# Patient Record
Sex: Female | Born: 1980 | Race: White | Hispanic: No | Marital: Married | Smoking: Former smoker
Health system: Southern US, Community
[De-identification: ages and names within clinical notes are randomized; demographics above are authoritative.]

## PROBLEM LIST (undated history)

## (undated) DIAGNOSIS — K649 Unspecified hemorrhoids: Secondary | ICD-10-CM

## (undated) DIAGNOSIS — K219 Gastro-esophageal reflux disease without esophagitis: Secondary | ICD-10-CM

## (undated) DIAGNOSIS — F419 Anxiety disorder, unspecified: Secondary | ICD-10-CM

## (undated) DIAGNOSIS — K602 Anal fissure, unspecified: Secondary | ICD-10-CM

## (undated) DIAGNOSIS — O1003 Pre-existing essential hypertension complicating the puerperium: Secondary | ICD-10-CM

## (undated) DIAGNOSIS — R87619 Unspecified abnormal cytological findings in specimens from cervix uteri: Secondary | ICD-10-CM

## (undated) DIAGNOSIS — O4290 Premature rupture of membranes, unspecified as to length of time between rupture and onset of labor, unspecified weeks of gestation: Secondary | ICD-10-CM

## (undated) HISTORY — DX: Gastro-esophageal reflux disease without esophagitis: K21.9

## (undated) HISTORY — DX: Anal fissure, unspecified: K60.2

## (undated) HISTORY — DX: Unspecified abnormal cytological findings in specimens from cervix uteri: R87.619

## (undated) HISTORY — DX: Anxiety disorder, unspecified: F41.9

## (undated) HISTORY — DX: Unspecified hemorrhoids: K64.9

## (undated) HISTORY — DX: Pre-existing essential hypertension complicating the puerperium: O10.03

---

## 2001-09-14 ENCOUNTER — Other Ambulatory Visit: Admission: RE | Admit: 2001-09-14 | Discharge: 2001-09-14 | Payer: Self-pay | Admitting: Obstetrics and Gynecology

## 2003-03-10 ENCOUNTER — Other Ambulatory Visit: Admission: RE | Admit: 2003-03-10 | Discharge: 2003-03-10 | Payer: Self-pay | Admitting: Obstetrics and Gynecology

## 2004-02-24 ENCOUNTER — Ambulatory Visit: Payer: Self-pay | Admitting: Family Medicine

## 2004-04-06 ENCOUNTER — Other Ambulatory Visit: Admission: RE | Admit: 2004-04-06 | Discharge: 2004-04-06 | Payer: Self-pay | Admitting: Obstetrics and Gynecology

## 2004-05-23 ENCOUNTER — Ambulatory Visit (HOSPITAL_COMMUNITY): Admission: RE | Admit: 2004-05-23 | Discharge: 2004-05-23 | Payer: Self-pay | Admitting: Obstetrics and Gynecology

## 2005-02-08 ENCOUNTER — Ambulatory Visit: Payer: Self-pay | Admitting: Family Medicine

## 2005-06-11 ENCOUNTER — Other Ambulatory Visit: Admission: RE | Admit: 2005-06-11 | Discharge: 2005-06-11 | Payer: Self-pay | Admitting: Obstetrics and Gynecology

## 2006-01-14 IMAGING — US US OB TRANSVAGINAL MODIFY
1 series · 18 of 28 positions shown · non-contrast
Comparison: none

CLINICAL DATA: Assess viability.
EARLY OBSTETRICAL ULTRASOUND WITH TRANSVAGINAL:
Multiple images of the uterus and adnexa were obtained using a transabdominal and endovaginal approaches. 
The uterus is retroverted in nature.  There is an intrauterine gestational sac identified which has a sac size correlating with a 6 week 2 day gestation.  There is a yolk sac visualized, but no evidence for a fetal pole is yet seen.  This would not necessarily be expected at today?s mean sac diameter of 1.4 cm, and follow-up in greater than four days would be recommended for assessment for appropriate progression of pregnancy, as we would expect to see a fetal pole by the time the mean sac diameter reaches 1.8 cm.  No evidence for subchorionic hemorrhage is noted.  
Both ovaries are visualized with the left ovary measuring 3.9 x 2.6 x 2.4 cm and containing a corpus luteum cyst.  The right ovary measures 2.5 x 1.0 x 2.6 cm and has a normal appearance.  No cul-de-sac or periovarian fluid is seen and no separate adnexal masses are noted.

[Series 1: us ob comp less 14 wks · 18 of 54 slices shown]
[im 1/54]
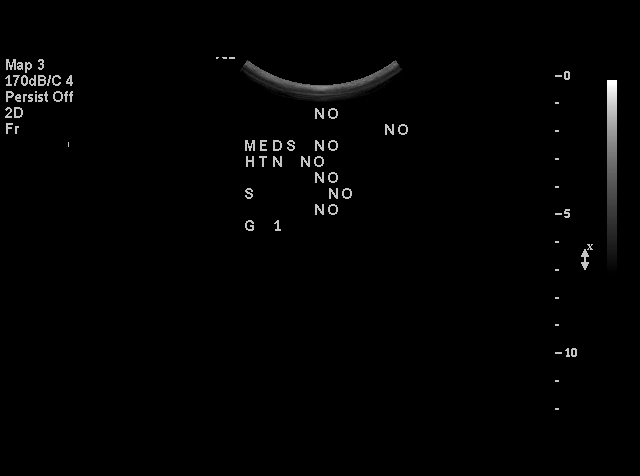
[im 4/54]
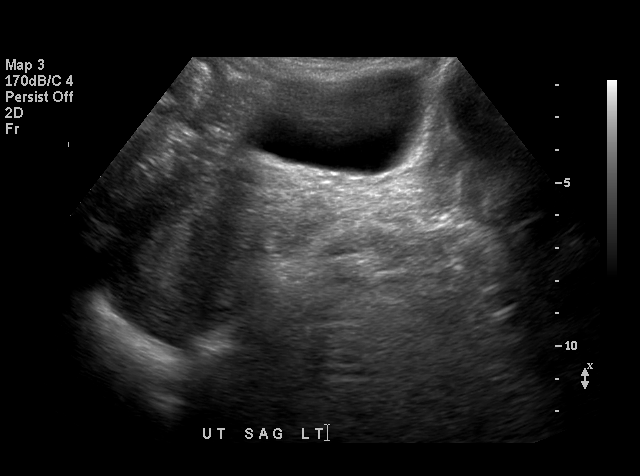
[im 6/54]
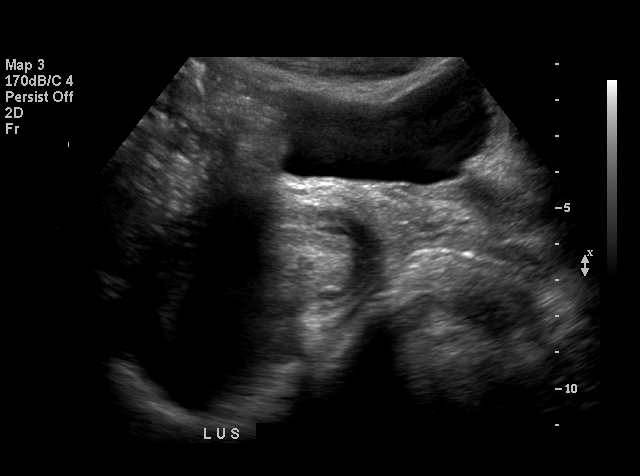
[im 10/54]
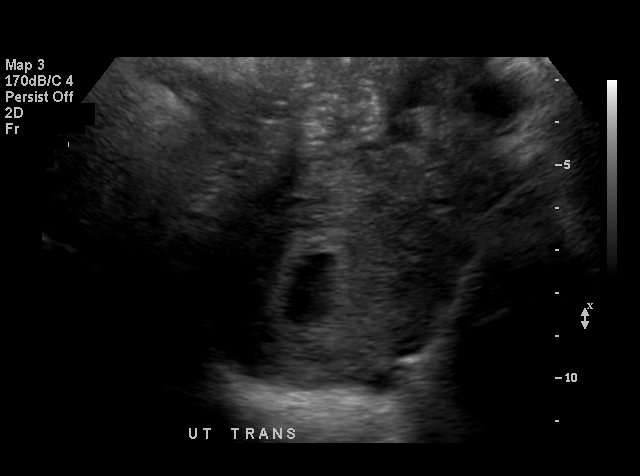
[im 14/54]
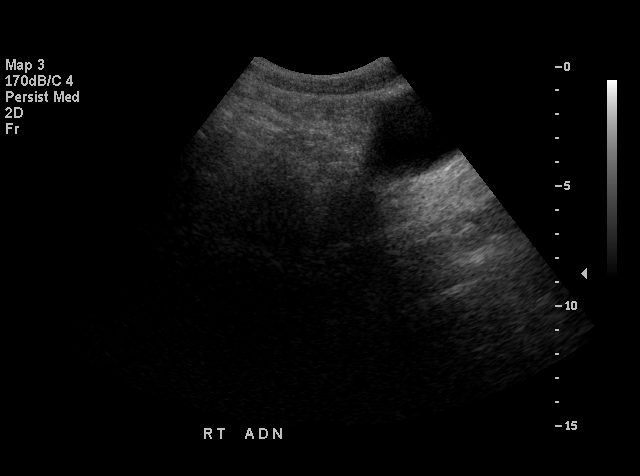
[im 16/54]
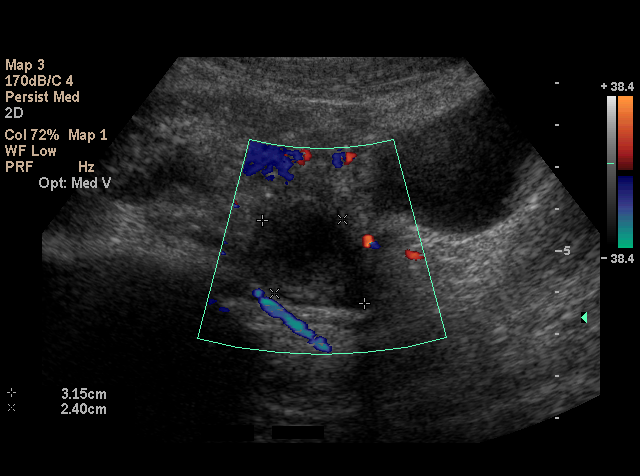
[im 20/54]
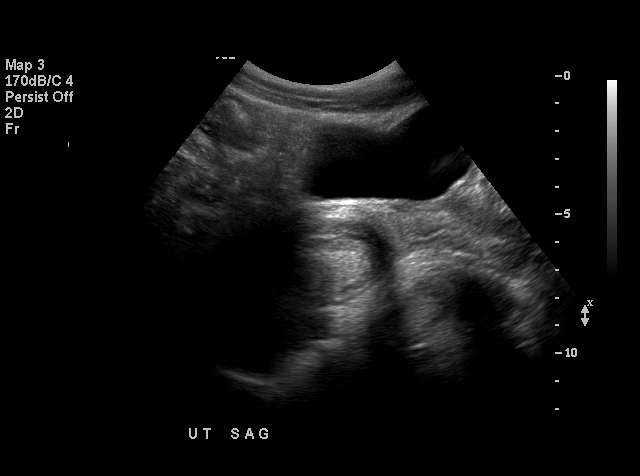
[im 22/54]
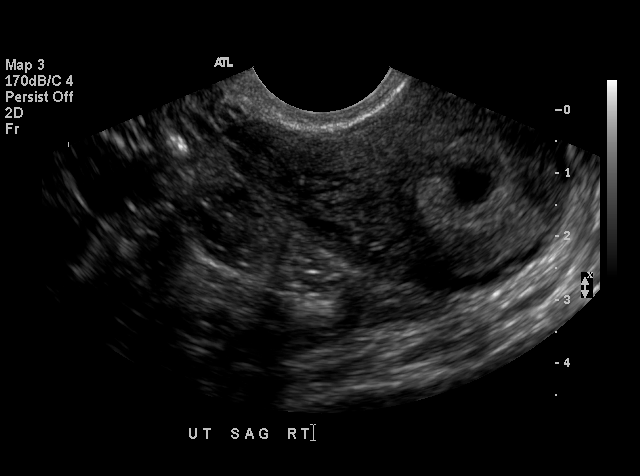
[im 26/54]
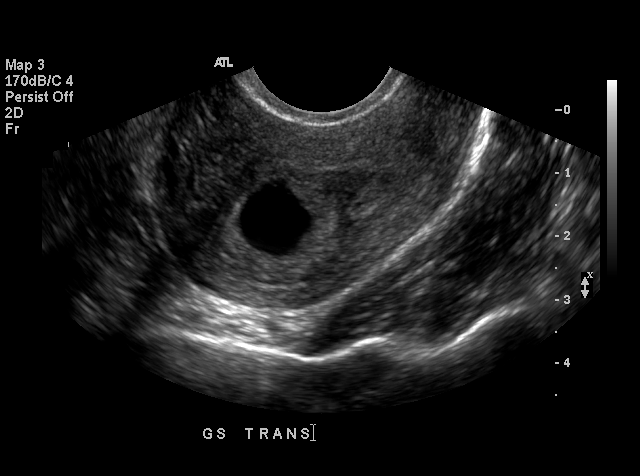
[im 28/54]
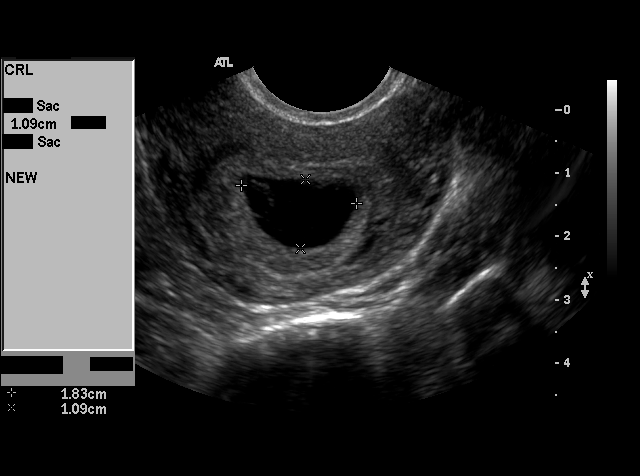
[im 32/54]
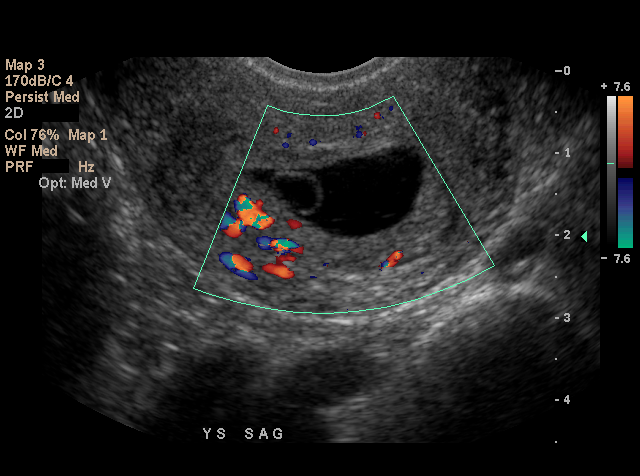
[im 34/54]
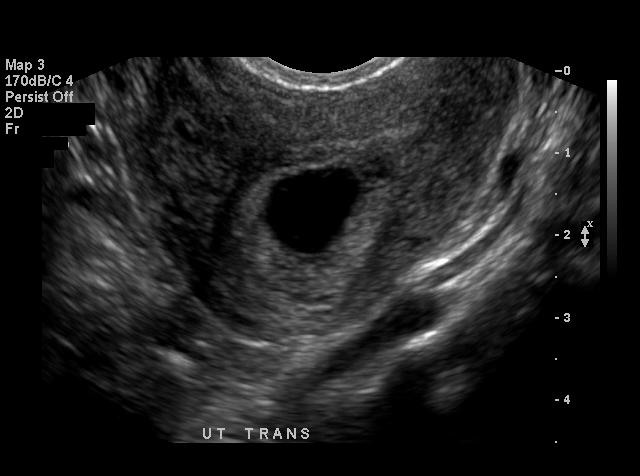
[im 38/54]
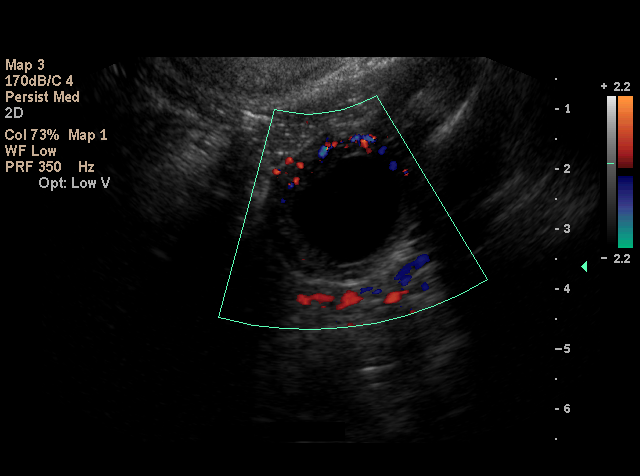
[im 42/54]
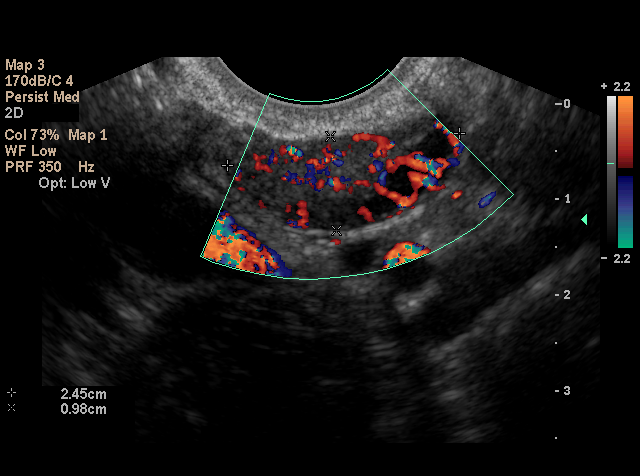
[im 44/54]
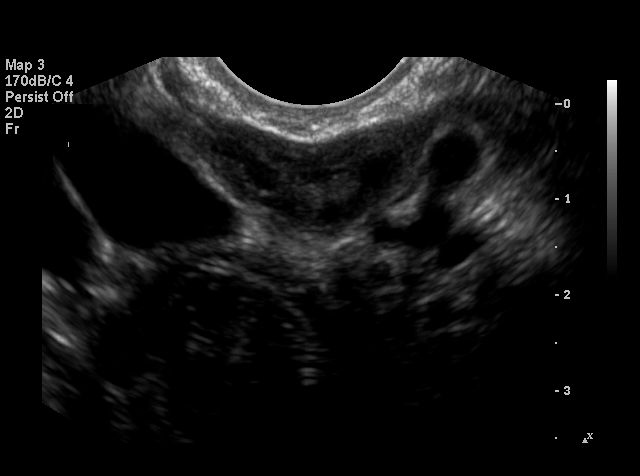
[im 48/54]
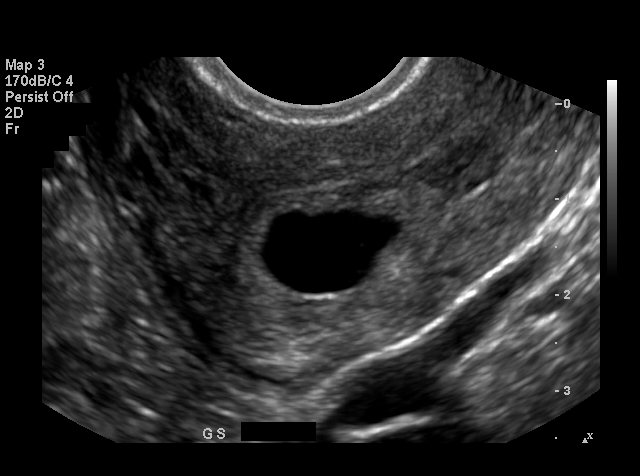
[im 50/54]
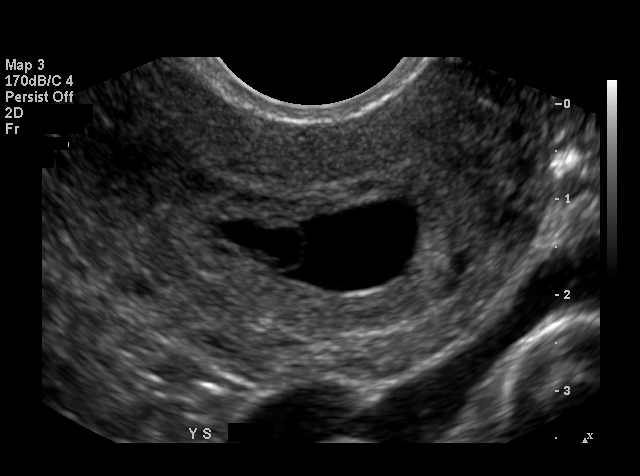
[im 54/54]
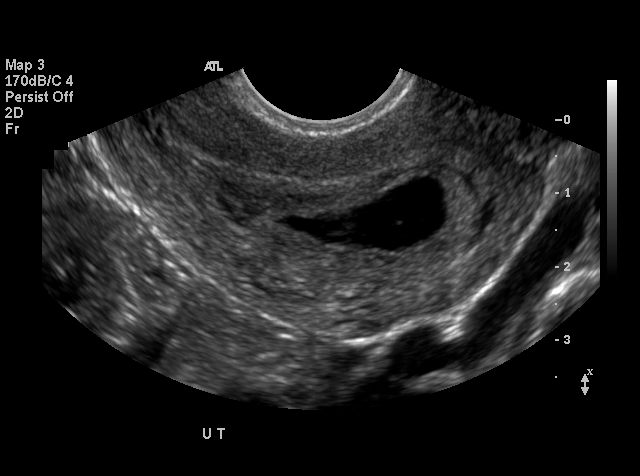

[18 of 28 positions shown; findings below may reference images not displayed]

IMPRESSION: 1.  6 week 2 day intrauterine gestational sac with yolk sac.  No fetal pole is yet seen, but would not necessarily be expected at today?s mean sac diameter.  Follow-up can be undertaken in greater than four days for assessment of appropriate progression of pregnancy.  
2.  Normal ovaries.

## 2006-09-25 ENCOUNTER — Encounter (INDEPENDENT_AMBULATORY_CARE_PROVIDER_SITE_OTHER): Payer: Self-pay | Admitting: Obstetrics and Gynecology

## 2006-09-25 ENCOUNTER — Inpatient Hospital Stay (HOSPITAL_COMMUNITY): Admission: AD | Admit: 2006-09-25 | Discharge: 2006-09-27 | Payer: Self-pay | Admitting: Obstetrics and Gynecology

## 2007-07-07 ENCOUNTER — Ambulatory Visit: Payer: Self-pay | Admitting: Family Medicine

## 2007-10-13 ENCOUNTER — Ambulatory Visit: Payer: Self-pay | Admitting: Family Medicine

## 2007-10-13 DIAGNOSIS — M5430 Sciatica, unspecified side: Secondary | ICD-10-CM

## 2007-10-13 DIAGNOSIS — M461 Sacroiliitis, not elsewhere classified: Secondary | ICD-10-CM | POA: Insufficient documentation

## 2009-05-10 ENCOUNTER — Ambulatory Visit: Payer: Self-pay | Admitting: Family Medicine

## 2009-05-10 LAB — CONVERTED CEMR LAB: Rapid Strep: NEGATIVE

## 2009-09-28 ENCOUNTER — Ambulatory Visit: Payer: Self-pay | Admitting: Internal Medicine

## 2010-02-01 ENCOUNTER — Ambulatory Visit
Admission: RE | Admit: 2010-02-01 | Discharge: 2010-02-01 | Payer: Self-pay | Source: Home / Self Care | Attending: Family Medicine | Admitting: Family Medicine
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
## 2010-02-20 NOTE — Assessment & Plan Note (Signed)
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 She has a daughter and had hand foot and mouth disease 5 mos ago. She is here today because she has white dots on the roof of her mouth with a cold for 2 weeks. She noticed the bumps this morning.  She has had temp to 100, she has headache in the perinasal area, mild ear pain, more popping than pain, rhinitis that is clear...had been yellow last week, no ST, mild coughing minimally productive now of clear sputum which had been  thick and yellow last week. She has had no SOB, no N/V. Her stomach has been slightly upset with mild diarrhea. She has taken Robitusin DM and Advil. She has had Claritin a few days ago.   Problems Prior to Update: 1)  Sciatica  (ICD-724.3) 2)  Sacroiliitis  (ICD-720.2) 3)  Uri  (ICD-465.9)  Medications Prior to Update: 1)  Multivitamins   Tabs (Multiple Vitamin) .... Take 1 Tablet By Mouth Once A Day  Allergies: No Known Drug Allergies  Physical Exam  General:  alert, well-developed, well-nourished, and well-hydrated.  NAD and looks nontoxic. Head:  Normocephalic and atraumatic without obvious abnormalities. No apparent alopecia or balding. Sinuses NT. Eyes:  Conjunctiva clear bilaterally.  Ears:  External ear exam shows no significant lesions or deformities.  Otoscopic examination reveals clear canals, tympanic membranes are intact bilaterally without bulging, retraction, inflammation or discharge. Hearing is grossly normal bilaterally. Nose:   no airflow obstruction, mucosal erythema, and mucosal edema.  sinuses neg Mouth:  no exudates and pharyngeal erythema except for approx 6-8 slightly vessicular pustular 2-68mm papules with erythematous ring surrounding each individual lesion.   Neck:  No deformities, masses, or tenderness noted. Lungs:  Normal respiratory effort, chest expands symmetrically. Lungs are clear to auscultation, no crackles or wheezes. Heart:  Normal rate and regular rhythm. S1 and S2 normal without gallop, murmur, click, rub or other extra sounds. Cervical Nodes:  No lymphadenopathy noted   Impression & Recommendations:  Problem # 1:  HERPANGINA (ICD-074.0) Assessment New Discussed and read from [facility]. Expect symptoms to fade and resolve without complication.  See instructions.  Complete Medication List: 1)  Multivitamins Tabs (Multiple vitamin) .... Take 1 tablet by mouth once a day  Other Orders: Rapid Strep ([address])  Patient Instructions: 1)  Take Guaifenesin by [facility] or [facility] and getting MUCOUS RELIEF EXPECTORANT (400mg ), take 11/2 tabs by mouth AM and NOON. 2)  Drink lots of fluids anytime taking Guaifenesin.  3)  Keep a lozenge in your mouth 4)  RTC if sxs don't resolve.  Current Allergies (reviewed today): No known allergies   Laboratory Results  Date/Time Received: May 10, 2009 2:53 PM  Date/Time Reported: May 10, 2009 2:53 PM   Other Tests  Rapid Strep: negative  Kit Test Internal QC:  Positive   (Normal Range: Negative)

## 2010-02-22 NOTE — Assessment & Plan Note (Signed)
Summary: cough/alc   Vital Signs:  Patient profile:   30 year old female Weight:      125 pounds Temp:     98.2 degrees F oral Pulse rate:   72 / minute Pulse rhythm:   regular BP sitting:   100 / 66  (left arm) Cuff size:   regular  Vitals Entered By: Sydell Axon LPN (February 01, 2010 10:31 AM) CC: Cough, productive at time   History of Present Illness: Pt here for chronic cough. She denies fever or chills, she has heaqdache frontally, a little ear pain, no rhinitis, some ST, cough minimally productive since last Sat...about five days....more frequent in the last three days. She denies N/V, no achiness. She has taken   Allergies: No Known Drug Allergies  Physical Exam  General:  alert, well-developed, well-nourished, and well-hydrated.  NAD and looks nontoxic. Head:  Normocephalic and atraumatic without obvious abnormalities. Sinuses NT.  Eyes:  Conjunctiva clear bilaterally.  Ears:  External ear exam shows no significant lesions or deformities.  Otoscopic examination reveals clear canals, tympanic membranes are intact bilaterally without bulging, retraction, inflammation or discharge. Hearing is grossly normal bilaterally. Nose:  External nasal examination shows no deformity or inflammation. Nasal mucosa are pink and moist without lesions or exudates. Patent nostrils bilat. Mouth:  Oral mucosa and oropharynx without lesions or exudates.  Teeth in good repair. Neck:  No deformities, masses, or tenderness noted. Chest Wall:  No deformities, masses, or tenderness noted. Lungs:  Normal respiratory effort, chest expands symmetrically. Lungs are clear to auscultation, no crackles or wheezes. Heart:  Normal rate and regular rhythm. S1 and S2 normal without gallop, murmur, click, rub or other extra sounds.   Impression & Recommendations:  Problem # 1:  URI (ICD-465.9) Assessment New  See instructions. Mild inflammation without focal s/s of infection. The following medications  were removed from the medication list:    Naprosyn 500 Mg Tabs (Naproxen) ..... One by mouth two times a day x 7 days then as needed  Instructed on symptomatic treatment. Call if symptoms persist or worsen.   Problem # 2:  CERVICAL STRAIN, ACUTE (ICD-847.0) Assessment: Improved Resolved quickly after being seen. The following medications were removed from the medication list:    Naprosyn 500 Mg Tabs (Naproxen) ..... One by mouth two times a day x 7 days then as needed    Cyclobenzaprine Hcl 10 Mg Tabs (Cyclobenzaprine hcl) ..... One by mouth two times a day as needed muscle spasm  Complete Medication List: 1)  Multivitamins Tabs (Multiple vitamin) .... Take 1 tablet by mouth once a day  Patient Instructions: 1)  Take Guaifenesin by [facility] and getting MUCOUS RELIEF EXPECTORANT (400mg ), take 11/2 tabs by mouth AM and NOON. 2)  Drink lots of fluids anytime taking Guaifenesin.    Orders Added: 1)  Est. Patient Level III [[address]]    Current Allergies (reviewed today): No known allergies

## 2010-06-05 NOTE — Discharge Summary (Signed)
Jacqueline Skinner, Jacqueline Skinner                ACCOUNT NO.:  [id number]   MEDICAL RECORD NO.:  [id number]          PATIENT TYPE:  INP   LOCATION:  9144                          FACILITY:  [facility]   PHYSICIAN:  Sherron Monday, MD        DATE OF BIRTH:  1980/07/04   DATE OF ADMISSION:  09/25/2006  DATE OF DISCHARGE:  09/27/2006                               DISCHARGE SUMMARY   ADMITTING DIAGNOSIS:  Intrauterine pregnancy at term.   DISCHARGE DIAGNOSIS:  Intrauterine pregnancy at term, delivered by  spontaneous vaginal delivery.   HISTORY OF PRESENT ILLNESS:  A 30year-old G2 P0-0-1-0 with an EDC of  September 14, , 2008, admitted with rupture of membranes at  approximately 2:50 a.m. on the 4th.  She had some ultrasounds which  verified her dates as well as an ultrasound for size less than dates,  revealing normal AFI and good growth.  The sterile speculum exam,  revealed: pool, Nitrazine positive and ferning.  She was admitted and  labored without any augmentation.   PAST MEDICAL HISTORY:  Significant for anxiety and IBS as well as  lactose intolerance.   PAST SURGICAL HISTORY:  Not significant.   PAST OB/GYN HISTORY:  1. G1 was a miscarriage.  2. G2 is the present pregnancy.  3. No abnormal Pap smears or sexually transmitted diseases.   MEDICATIONS:  Prenatal vitamins.   ALLERGIES:  No known drug allergies.   SOCIAL HISTORY:  No alcohol or drug use or tobacco use.  She quit  smoking approximately a year ago.   FAMILY HISTORY:  Significant for hypertension and diabetes in paternal  grandmother and breast cancer in paternal grandmother.   On admission, afebrile with vital signs and a benign exam.  Fetal heart  tones were reactive.  Cervix was fully dilated.  She pushed well, had a  tight internal band, and an episiotomy was performed in a left  mediolateral fashion with a local block.  She delivered a viable female  infant weighing 7 pounds 0 ounces and Apgars of 9 and 9.  Placenta was  delivered intact.  Episiotomy was repaired with 3-0 and 2-0 Vicryl.  Postpartum course was relatively uncomplicated.  She is discharged to  home on postpartum day #2 with normal lochia and pain well controlled.  She had remained afebrile and with stable vital signs since delivery.  She had worked with Advertising copywriter.  She is discharged to home  with medication and routine discharge instructions as well as a  prescription for Motrin and  Vicodin and prenatal vitamins.  She will follow up in 6 weeks.  She  plans to breast feed.  She is O positive, rubella immune, her postpartum  pain level was 7.9, and she will discuss contraception at her postpartum  visit with Dr. Ambrose Mantle.      Sherron Monday, MD  Electronically Signed     JB/MEDQ  D:  09/27/2006  T:  09/27/2006  Job:  [id number]

## 2010-11-02 LAB — CBC
HCT: 34.6 — ABNORMAL LOW
Hemoglobin: 13.2
MCHC: 34.5
MCHC: 35.2
MCV: 90.2
Platelets: 209
RDW: 13
WBC: 8.9

## 2010-11-22 ENCOUNTER — Other Ambulatory Visit: Payer: Self-pay | Admitting: Obstetrics and Gynecology

## 2010-11-22 DIAGNOSIS — N63 Unspecified lump in unspecified breast: Secondary | ICD-10-CM

## 2010-11-30 ENCOUNTER — Ambulatory Visit
Admission: RE | Admit: 2010-11-30 | Discharge: 2010-11-30 | Disposition: A | Discharge status: Home / Self Care | Payer: Commercial Indemnity | Source: Ambulatory Visit | Attending: Obstetrics and Gynecology | Admitting: Obstetrics and Gynecology

## 2010-11-30 DIAGNOSIS — N63 Unspecified lump in unspecified breast: Secondary | ICD-10-CM

## 2012-03-18 ENCOUNTER — Ambulatory Visit (INDEPENDENT_AMBULATORY_CARE_PROVIDER_SITE_OTHER): Payer: Commercial Indemnity | Admitting: Family Medicine

## 2012-03-18 ENCOUNTER — Encounter: Payer: Self-pay | Admitting: Family Medicine

## 2012-03-18 ENCOUNTER — Ambulatory Visit (INDEPENDENT_AMBULATORY_CARE_PROVIDER_SITE_OTHER)
Admission: RE | Admit: 2012-03-18 | Discharge: 2012-03-18 | Disposition: A | Discharge status: Home / Self Care | Payer: Commercial Indemnity | Source: Ambulatory Visit | Attending: Family Medicine | Admitting: Family Medicine

## 2012-03-18 VITALS — BP 110/72 | HR 73 | Temp 98.4°F | Ht 61.0 in | Wt 128.5 lb

## 2012-03-18 DIAGNOSIS — M79644 Pain in right finger(s): Secondary | ICD-10-CM

## 2012-03-18 NOTE — Progress Notes (Signed)
Nature conservation officer at [facility] [address] [place] [address] Phone: [phone] Fax: [phone]  Date:  03/18/2012   Name:  Jacqueline Skinner   DOB:  08-30-1980   MRN:  [id number] Gender: female Age: 32 y.o.  Primary Physician:  Eustaquio Boyden, MD  Evaluating MD: Hannah Beat, MD   Chief Complaint: thumb pain   History of Present Illness:  Jacqueline Skinner is a 32 y.o. pleasant patient who presents with the following:  Pleasant young female patient who was perfectly healthy who presents with right-sided thumb pain after she closed her car door on her thumb yesterday. At that time, the Car door was locked, and she has had significant pain in her thumb, mostly on the MCP region, and she also has developed a significant amount of blood and pressure underneath her thumbnail.  Patient Active Problem List  Diagnosis  . SACROILIITIS  . SCIATICA    No past medical history on file.  No past surgical history on file.  History   Social History  . Marital Status: Married    Spouse Name: N/A    Number of Children: N/A  . Years of Education: N/A   Occupational History  . Not on file.   Social History Main Topics  . Smoking status: Unknown If Ever Smoked  . Smokeless tobacco: Not on file  . Alcohol Use: Not on file  . Drug Use: Not on file  . Sexually Active: Not on file   Other Topics Concern  . Not on file   Social History Narrative  . No narrative on file    No family history on file.  No Known Allergies  Medication list has been reviewed and updated.  No outpatient prescriptions prior to visit.   No facility-administered medications prior to visit.    Review of Systems:   GEN: No fevers, chills. Nontoxic. Primarily MSK c/o today. MSK: Detailed in the HPI GI: tolerating PO intake without difficulty Neuro: No numbness, parasthesias, or tingling associated. Otherwise the pertinent positives of the ROS are noted above.    Physical  Examination: BP 110/72  Pulse 73  Temp(Src) 98.4 F (36.9 C) (Oral)  Ht 5\' 1"  (1.549 m)  Wt 128 lb 8 oz (58.287 kg)  BMI 24.29 kg/m2  SpO2 99%  Ideal Body Weight: Weight in (lb) to have BMI = 25: 132   GEN: WDWN, NAD, Non-toxic, Alert & Oriented x 3 HEENT: Atraumatic, Normocephalic.  Ears and Nose: No external deformity. EXTR: No clubbing/cyanosis/edema NEURO: Normal gait.  PSYCH: Normally interactive. Conversant. Not depressed or anxious appearing.  Calm demeanor.   Nontender throughout entirety of the right wrist. Nontender along right distal radius and distal ulna. Nontender in the carpal region. Nontender at the scaphoid. Nontender at the look of the hamate. Axial loading is nontender. Significant subungual hematoma. This is at the first digit. Nontender along all other digits. There is some tenderness at the IP, however flexion and extension is intact. There is tenderness with varus stress at the R IP, 1st.  Dg Finger Thumb Right  03/18/2012  *RADIOLOGY REPORT*  Clinical Data: Trauma, rule out thumb fracture  RIGHT THUMB 2+V  Comparison: None.  Findings: Three views of the right thumb submitted.  No acute fracture or subluxation.  No radiopaque foreign body.  IMPRESSION: No acute fracture or subluxation.   Original Report Authenticated By: Natasha Mead, M.D.     Assessment and Plan:  Thumb pain, right - Plan: DG Finger Thumb Right  Hematoma, subungual, thumb, right, initial encounter  Collateral ligament sprain, first right IP joint. Reassured the patient. Nothing additional needs to be done.  Procedure: Evacuation of subungual hematoma. Indication: Pain Verbal consent was obtained. The patient was prepped with alcohol, and a lighter was used to heat and 18-gauge needle, which was then used to open a conduit for the blood to leave the nail space. This provided relief.  Orders Today:  Orders Placed This Encounter  Procedures  . DG Finger Thumb Right    Standing Status:  Future     Number of Occurrences: 1     Standing Expiration Date: 05/18/2013    Order Specific Question:  Reason for exam:    Answer:  trauma, rule out potential thumb fracture    Order Specific Question:  Is the patient pregnant?    Answer:  No    Order Specific Question:  Preferred imaging location?    Answer:  [facility]    Updated Medication List: (Includes new medications, updates to list, dose adjustments) Meds ordered this encounter  Medications  . drospirenone-ethinyl estradiol (YASMIN 28) 3-0.03 MG tablet    Sig: Take 1 tablet by mouth daily.    Medications Discontinued: There are no discontinued medications.    Signed, Elpidio Galea. Kenyia Wambolt, MD 03/18/2012 1:52 PM

## 2013-02-26 ENCOUNTER — Encounter: Payer: Self-pay | Admitting: Family Medicine

## 2013-02-26 ENCOUNTER — Ambulatory Visit (INDEPENDENT_AMBULATORY_CARE_PROVIDER_SITE_OTHER): Payer: Commercial Indemnity | Admitting: Family Medicine

## 2013-02-26 VITALS — BP 100/60 | HR 84 | Temp 99.2°F | Ht 61.0 in | Wt 120.8 lb

## 2013-02-26 DIAGNOSIS — J020 Streptococcal pharyngitis: Secondary | ICD-10-CM | POA: Insufficient documentation

## 2013-02-26 DIAGNOSIS — J029 Acute pharyngitis, unspecified: Secondary | ICD-10-CM

## 2013-02-26 LAB — POCT RAPID STREP A (OFFICE): Rapid Strep A Screen: POSITIVE — AB

## 2013-02-26 MED ORDER — AMOXICILLIN 500 MG PO CAPS
1000.0000 mg | ORAL_CAPSULE | Freq: Two times a day (BID) | ORAL | Status: DC
Start: 2013-02-26 — End: 2013-10-06

## 2013-02-26 NOTE — Assessment & Plan Note (Signed)
Patient appears moderately ill. Temp as noted above. Pharyngo-tonsillitis is noted. Anterior cervical nodes are present.  Ears are normal, chest is clear.  Rapid strep test is positive. No rashes. No hepatosplenomegaly.  Treat with 10 days of amoxicillin.

## 2013-02-26 NOTE — Progress Notes (Signed)
   Subjective:    Patient ID: Jacqueline Skinner, female    DOB: 1980-03-09, 33 y.o.   MRN: [id number]  Sore Throat  This is a new problem. The current episode started yesterday. The problem has been gradually worsening. Neither side of throat is experiencing more pain than the other. There has been no fever. The pain is mild. Associated symptoms include trouble swallowing. Pertinent negatives include no abdominal pain, congestion, ear discharge or shortness of breath. Associated symptoms comments: Body aches. She has had exposure to strep. She has tried NSAIDs for the symptoms. The treatment provided moderate relief.      Review of Systems  Constitutional: Positive for fatigue.  HENT: Positive for trouble swallowing. Negative for congestion and ear discharge.   Respiratory: Negative for chest tightness and shortness of breath.   Cardiovascular: Negative for chest pain.  Gastrointestinal: Negative for abdominal pain.       Objective:   Physical Exam  Constitutional: Vital signs are normal. She appears well-developed and well-nourished. She is cooperative.  Non-toxic appearance. She does not appear ill. No distress.  HENT:  Head: Normocephalic.  Right Ear: Hearing, tympanic membrane, external ear and ear canal normal. Tympanic membrane is not erythematous, not retracted and not bulging.  Left Ear: Hearing, tympanic membrane, external ear and ear canal normal. Tympanic membrane is not erythematous, not retracted and not bulging.  Nose: Mucosal edema and rhinorrhea present. Right sinus exhibits no maxillary sinus tenderness and no frontal sinus tenderness. Left sinus exhibits no maxillary sinus tenderness and no frontal sinus tenderness.  Mouth/Throat: Uvula is midline and mucous membranes are normal. Posterior oropharyngeal edema and posterior oropharyngeal erythema present. No oropharyngeal exudate.  Eyes: Conjunctivae, EOM and lids are normal. Pupils are equal, round, and reactive to light.  Lids are everted and swept, no foreign bodies found.  Neck: Trachea normal and normal range of motion. Neck supple. Carotid bruit is not present. No mass and no thyromegaly present.  Cardiovascular: Normal rate, regular rhythm, S1 normal, S2 normal, normal heart sounds, intact distal pulses and normal pulses.  Exam reveals no gallop and no friction rub.   No murmur heard. Pulmonary/Chest: Effort normal and breath sounds normal. Not tachypneic. No respiratory distress. She has no decreased breath sounds. She has no wheezes. She has no rhonchi. She has no rales.  Neurological: She is alert.  Skin: Skin is warm, dry and intact. No rash noted.  Psychiatric: Her speech is normal and behavior is normal. Judgment normal. Her mood appears not anxious. Cognition and memory are normal. She does not exhibit a depressed mood.          Assessment & Plan:

## 2013-02-26 NOTE — Patient Instructions (Signed)
Complete antibiotics, push fluids, ibuprofen for sore throat, HA and body ache.  Call if not improving as expected.    Strep Throat Strep throat is an infection of the throat caused by a bacteria named Streptococcus pyogenes. Your caregiver may call the infection streptococcal "tonsillitis" or "pharyngitis" depending on whether there are signs of inflammation in the tonsils or back of the throat. Strep throat is most common in children aged 32 15 years during the cold months of the year, but it can occur in people of any age during any season. This infection is spread from person to person (contagious) through coughing, sneezing, or other close contact. SYMPTOMS   Fever or chills.  Painful, swollen, red tonsils or throat.  Pain or difficulty when swallowing.  White or yellow spots on the tonsils or throat.  Swollen, tender lymph nodes or "glands" of the neck or under the jaw.  Red rash all over the body (rare). DIAGNOSIS  Many different infections can cause the same symptoms. A test must be done to confirm the diagnosis so the right treatment can be given. A "rapid strep test" can help your caregiver make the diagnosis in a few minutes. If this test is not available, a light swab of the infected area can be used for a throat culture test. If a throat culture test is done, results are usually available in a day or two. TREATMENT  Strep throat is treated with antibiotic medicine. HOME CARE INSTRUCTIONS   Gargle with 1 tsp of salt in 1 cup of warm water, 3 4 times per day or as needed for comfort.  Family members who also have a sore throat or fever should be tested for strep throat and treated with antibiotics if they have the strep infection.  Make sure everyone in your household washes their hands well.  Do not share food, drinking cups, or personal items that could cause the infection to spread to others.  You may need to eat a soft food diet until your sore throat gets  better.  Drink enough water and fluids to keep your urine clear or pale yellow. This will help prevent dehydration.  Get plenty of rest.  Stay home from school, daycare, or work until you have been on antibiotics for 24 hours.  Only take over-the-counter or prescription medicines for pain, discomfort, or fever as directed by your caregiver.  If antibiotics are prescribed, take them as directed. Finish them even if you start to feel better. SEEK MEDICAL CARE IF:   The glands in your neck continue to enlarge.  You develop a rash, cough, or earache.  You cough up green, yellow-brown, or bloody sputum.  You have pain or discomfort not controlled by medicines.  Your problems seem to be getting worse rather than better. SEEK IMMEDIATE MEDICAL CARE IF:   You develop any new symptoms such as vomiting, severe headache, stiff or painful neck, chest pain, shortness of breath, or trouble swallowing.  You develop severe throat pain, drooling, or changes in your voice.  You develop swelling of the neck, or the skin on the neck becomes red and tender.  You have a fever.  You develop signs of dehydration, such as fatigue, dry mouth, and decreased urination.  You become increasingly sleepy, or you cannot wake up completely. Document Released: 01/05/2000 Document Revised: 12/25/2011 Document Reviewed: 03/08/2010 [facility] Patient Information 2014 [employer], [place].

## 2013-02-26 NOTE — Progress Notes (Signed)
Pre-visit discussion using our clinic review tool. No additional management support is needed unless otherwise documented below in the visit note.  

## 2013-10-06 ENCOUNTER — Encounter: Payer: Self-pay | Admitting: Family Medicine

## 2013-10-06 ENCOUNTER — Encounter (INDEPENDENT_AMBULATORY_CARE_PROVIDER_SITE_OTHER): Payer: Self-pay

## 2013-10-06 ENCOUNTER — Ambulatory Visit (INDEPENDENT_AMBULATORY_CARE_PROVIDER_SITE_OTHER): Payer: Commercial Indemnity | Admitting: Family Medicine

## 2013-10-06 VITALS — BP 104/70 | HR 74 | Temp 98.7°F | Ht 61.0 in | Wt 123.5 lb

## 2013-10-06 DIAGNOSIS — J069 Acute upper respiratory infection, unspecified: Secondary | ICD-10-CM | POA: Insufficient documentation

## 2013-10-06 DIAGNOSIS — B9789 Other viral agents as the cause of diseases classified elsewhere: Principal | ICD-10-CM

## 2013-10-06 MED ORDER — GUAIFENESIN-CODEINE 100-10 MG/5ML PO SYRP: 5.0000 mL | ORAL_SOLUTION | Freq: Four times a day (QID) | ORAL | Status: DC | PRN

## 2013-10-06 MED ORDER — BENZONATATE 200 MG PO CAPS: 200.0000 mg | ORAL_CAPSULE | Freq: Three times a day (TID) | ORAL | Status: DC | PRN

## 2013-10-06 NOTE — Assessment & Plan Note (Signed)
Disc symptomatic care - see instructions on AVS  Tessalon tid prn  Robitussin ac at night or when not working (warned of sedation)   Fluids/ rest  Update if not starting to improve in a week or if worsening

## 2013-10-06 NOTE — Progress Notes (Signed)
Pre visit review using our clinic review tool, if applicable. No additional management support is needed unless otherwise documented below in the visit note. 

## 2013-10-06 NOTE — Patient Instructions (Signed)
You have a viral upper respiratory infection with cough  Drink lots of fluids and get extra rest  Take tessalon as directed for cough  When home/not working- you can take the codeine cough syrup  Update if not starting to improve in a week or if worsening

## 2013-10-06 NOTE — Progress Notes (Signed)
   Subjective:    Patient ID: Jacqueline Skinner, female    DOB: 1980/04/05, 33 y.o.   MRN: [id number]  HPI Here with uri symptoms for a week  Bad cough  Eyes are dripping and matted shut in the am  Lot of sneezing and runny nose No fever or chills or aches   Taking sudafed and zyrtec otc   Yellow mucous -prod cough  Nasal d/c is also yellow   Some sinus pressure above eyes   Patient Active Problem List   Diagnosis Date Noted  . Strep pharyngitis 02/26/2013  . SACROILIITIS 10/13/2007  . SCIATICA 10/13/2007   No past medical history on file. No past surgical history on file. History  Substance Use Topics  . Smoking status: Former Games developer  . Smokeless tobacco: Never Used  . Alcohol Use: Yes   No family history on file. No Known Allergies Current Outpatient Prescriptions on File Prior to Visit  Medication Sig Dispense Refill  . drospirenone-ethinyl estradiol (YASMIN 28) 3-0.03 MG tablet Take 1 tablet by mouth daily.       No current facility-administered medications on file prior to visit.    Review of Systems Review of Systems  Constitutional: Negative for fever, appetite change,  and unexpected weight change. pos for fatigue ENt pos for congestion/rhinorrhea/ sinus pressure and ear pressure Eyes: Negative for pain and visual disturbance.  Respiratory: Negative for wheeze and shortness of breath.   Cardiovascular: Negative for cp or palpitations    Gastrointestinal: Negative for nausea, diarrhea and constipation.  Genitourinary: Negative for urgency and frequency.  Skin: Negative for pallor or rash   Neurological: Negative for weakness, light-headedness, numbness and headaches.  Hematological: Negative for adenopathy. Does not bruise/bleed easily.  Psychiatric/Behavioral: Negative for dysphoric mood. The patient is not nervous/anxious.         Objective:   Physical Exam  Constitutional: She appears well-developed and well-nourished. No distress.  HENT:  Head:  Normocephalic and atraumatic.  Right Ear: External ear normal.  Left Ear: External ear normal.  Mouth/Throat: Oropharynx is clear and moist. No oropharyngeal exudate.  Nares are injected and congested   No sinus tenderness Clear rhinorrhea   Eyes: Conjunctivae and EOM are normal. Pupils are equal, round, and reactive to light. Right eye exhibits no discharge. Left eye exhibits no discharge. No scleral icterus.  Neck: Normal range of motion. Neck supple.  Cardiovascular: Normal rate, regular rhythm and normal heart sounds.   Pulmonary/Chest: Effort normal and breath sounds normal. No respiratory distress. She has no wheezes. She has no rales.  Harsh hacking cough  Lymphadenopathy:    She has no cervical adenopathy.  Neurological: She is alert.  Skin: Skin is warm and dry. No rash noted.  Psychiatric: She has a normal mood and affect.          Assessment & Plan:   Problem List Items Addressed This Visit     Respiratory   Viral URI with cough - Primary     Disc symptomatic care - see instructions on AVS  Tessalon tid prn  Robitussin ac at night or when not working (warned of sedation)   Fluids/ rest  Update if not starting to improve in a week or if worsening

## 2013-10-11 ENCOUNTER — Encounter: Payer: Self-pay | Admitting: Family Medicine

## 2013-10-11 ENCOUNTER — Telehealth: Payer: Self-pay | Admitting: Family Medicine

## 2013-10-11 ENCOUNTER — Ambulatory Visit (INDEPENDENT_AMBULATORY_CARE_PROVIDER_SITE_OTHER): Payer: Commercial Indemnity | Admitting: Family Medicine

## 2013-10-11 VITALS — BP 108/70 | HR 83 | Temp 99.1°F | Ht 61.0 in | Wt 122.0 lb

## 2013-10-11 DIAGNOSIS — J019 Acute sinusitis, unspecified: Secondary | ICD-10-CM | POA: Insufficient documentation

## 2013-10-11 DIAGNOSIS — K121 Other forms of stomatitis: Secondary | ICD-10-CM | POA: Insufficient documentation

## 2013-10-11 DIAGNOSIS — J0110 Acute frontal sinusitis, unspecified: Secondary | ICD-10-CM

## 2013-10-11 DIAGNOSIS — K1370 Unspecified lesions of oral mucosa: Secondary | ICD-10-CM

## 2013-10-11 MED ORDER — AMOXICILLIN-POT CLAVULANATE 875-125 MG PO TABS: 1.0000 | ORAL_TABLET | Freq: Two times a day (BID) | ORAL | Status: DC

## 2013-10-11 NOTE — Assessment & Plan Note (Addendum)
Suspect viral from recent uri Disc symptomatic care-salt water gargle/chloraseptic Ronnie Derby Update if not starting to improve in a week or if worsening

## 2013-10-11 NOTE — Patient Instructions (Signed)
Drink lots of fluids  Gargle with salt water if needed  Swish with salt water for mouth ulcers  Chloraseptic spray helps also , and for mouth ulcers- ambesol over the counter  Take the augmentin as directed for sinus infection  Update if not starting to improve in a week or if worsening

## 2013-10-11 NOTE — Progress Notes (Signed)
Subjective:    Patient ID: Jacqueline Skinner, female    Nature Kueker8/1982, 33 y.o.   MRN: [id number]  HPI Here for f/u of uri  Cough is improved but nasal congestion is worse  Having some sinus pain and pressure  Yellow nasal discharge   Sore throat  Had some white spots in her throat this am -mostly on L side  Smaller than they were this am  Ulcers on tongue and inside of cheek   She is loosing her ins wed - for 2 weeks   Patient Active Problem List   Diagnosis Date Noted  . Viral URI with cough 10/06/2013  . Strep pharyngitis 02/26/2013  . SACROILIITIS 10/13/2007  . SCIATICA 10/13/2007   No past medical history on file. No past surgical history on file. History  Substance Use Topics  . Smoking status: Former Games developer  . Smokeless tobacco: Never Used  . Alcohol Use: Yes     Comment: occ   No family history on file. No Known Allergies Current Outpatient Prescriptions on File Prior to Visit  Medication Sig Dispense Refill  . benzonatate (TESSALON) 200 MG capsule Take 1 capsule (200 mg total) by mouth 3 (three) times daily as needed for cough (swallow whole).  30 capsule  1  . drospirenone-ethinyl estradiol (YASMIN 28) 3-0.03 MG tablet Take 1 tablet by mouth daily.      Marland Kitchen guaiFENesin-codeine (ROBITUSSIN AC) 100-10 MG/5ML syrup Take 5 mLs by mouth 4 (four) times daily as needed for cough.  120 mL  0   No current facility-administered medications on file prior to visit.    Review of Systems Review of Systems  Constitutional: Negative for fever, appetite change,  and unexpected weight change. pos for fatigue ENt pos for cong and sinus pain and st  Eyes: Negative for pain and visual disturbance.  Respiratory: Negative for wheeze and shortness of breath.   Cardiovascular: Negative for cp or palpitations    Gastrointestinal: Negative for nausea, diarrhea and constipation.  Genitourinary: Negative for urgency and frequency.  Skin: Negative for pallor or rash   Neurological:  Negative for weakness, light-headedness, numbness and headaches.  Hematological: Negative for adenopathy. Does not bruise/bleed easily.  Psychiatric/Behavioral: Negative for dysphoric mood. The patient is not nervous/anxious.         Objective:   Physical Exam  Constitutional: She appears well-developed and well-nourished. No distress.  HENT:  Head: Normocephalic and atraumatic.  Right Ear: External ear normal.  Left Ear: External ear normal.  Mouth/Throat: Oropharynx is clear and moist. No oropharyngeal exudate.  Nares are injected and congested  Bilateral frontal sinus tenderness  Tonsillar debris noted  Throat is otherwise clear  Small apthous ulcer with white base under tongue on the L   Eyes: Conjunctivae and EOM are normal. Pupils are equal, round, and reactive to light. Right eye exhibits no discharge. Left eye exhibits no discharge.  Neck: Normal range of motion. Neck supple.  Cardiovascular: Normal rate, regular rhythm and normal heart sounds.   Pulmonary/Chest: Effort normal and breath sounds normal. No respiratory distress. She has no wheezes. She has no rales.  Lymphadenopathy:    She has no cervical adenopathy.  Neurological: She is alert.  Skin: Skin is warm and dry. No rash noted.  Psychiatric: She has a normal mood and affect.          Assessment & Plan:   Problem List Items Addressed This Visit     Respiratory   Acute sinusitis - Primary  S/p viral uri  Cover with augmentin  Update if not starting to improve in a week or if worsening  Disc symptomatic care - see instructions on AVS       Relevant Medications      amoxicillin-clavulanate (AUGMENTIN) tablet 875-125 mg     Digestive   Mouth ulcers     Suspect viral from recent uri Disc symptomatic care-salt water gargle/chloraseptic Ronnie Derby Update if not starting to improve in a week or if worsening

## 2013-10-11 NOTE — Telephone Encounter (Signed)
Pt was saw on 9/16 and was dx with URI. Pt now has white spots on back of throat, ulcers in mouth. Pt still has cough, nasal pain, and congestion. Pt insurance ends on Wednesday and wants to get this cleared up before it ends.Karin Golden in [place]. Please advise. Pt request c/b [phone]

## 2013-10-11 NOTE — Assessment & Plan Note (Signed)
S/p viral uri  Cover with augmentin  Update if not starting to improve in a week or if worsening  Disc symptomatic care - see instructions on AVS

## 2013-10-11 NOTE — Telephone Encounter (Signed)
Please get her in before Wed with first avail -it may just be viral ulcers but it deserves a look-thanks

## 2013-10-11 NOTE — Telephone Encounter (Signed)
Scheduled 11:15 am today!

## 2013-10-11 NOTE — Progress Notes (Signed)
Pre visit review using our clinic review tool, if applicable. No additional management support is needed unless otherwise documented below in the visit note. 

## 2013-10-15 ENCOUNTER — Telehealth: Payer: Self-pay | Admitting: Family Medicine

## 2013-10-15 NOTE — Telephone Encounter (Signed)
Noted. On augmentin for sinusitis.

## 2013-10-15 NOTE — Telephone Encounter (Signed)
Patient Information:  Caller Name: Gayathri  Phone: [phone]  Patient: Jacqueline Skinner, Jacqueline Skinner  Gender: Female  DOB: 1980/08/12  Age: 33 Years  PCP: [facility] [employer] [employer] [facility])  Pregnant: No  Office Follow Up:  Does the office need to follow up with this patient?: No  Instructions For The Office: N/A  RN Note:  Patient calling regarding sore throat on right side only.  States "I've been in office twice for the sore throat and last time was given amoxicillin."  Currently on antibiotic for past 48 hours.  Awoke this AM with dry mouth and sore throat on one side.  Afebrile.  States she does feel better since starting the Amoxicillin.  All triage negative.  Symptoms  Reason For Call & Symptoms: mild sore throat  Reviewed Health History In EMR: Yes  Reviewed Medications In EMR: Yes  Reviewed Allergies In EMR: Yes  Reviewed Surgeries / Procedures: Yes  Date of Onset of Symptoms: 10/15/2013 OB / GYN:  LMP: 10/04/2013  Guideline(s) Used:  Sore Throat  Disposition Per Guideline:   Home Care  Reason For Disposition Reached:   Sore throat  Advice Given:  For Relief of Sore Throat Pain:  Gargle warm salt water 3 times daily (1 teaspoon of salt in 8 oz or 240 ml of warm water).  Call Back If:  You become worse.  Patient Will Follow Care Advice:  YES

## 2015-01-22 HISTORY — PX: LEEP: SHX91

## 2015-03-18 ENCOUNTER — Encounter: Payer: Self-pay | Admitting: Family Medicine

## 2015-03-18 DIAGNOSIS — R87619 Unspecified abnormal cytological findings in specimens from cervix uteri: Secondary | ICD-10-CM

## 2015-03-18 HISTORY — DX: Unspecified abnormal cytological findings in specimens from cervix uteri: R87.619

## 2015-04-04 ENCOUNTER — Encounter: Payer: Self-pay | Admitting: Family Medicine

## 2016-03-10 ENCOUNTER — Encounter: Payer: Self-pay | Admitting: Family Medicine

## 2016-08-20 LAB — OB RESULTS CONSOLE GC/CHLAMYDIA
CHLAMYDIA, DNA PROBE: NEGATIVE
Gonorrhea: NEGATIVE

## 2016-08-20 LAB — OB RESULTS CONSOLE ANTIBODY SCREEN: ANTIBODY SCREEN: NEGATIVE

## 2016-08-20 LAB — OB RESULTS CONSOLE RPR: RPR: NONREACTIVE

## 2016-08-20 LAB — OB RESULTS CONSOLE ABO/RH: RH TYPE: POSITIVE

## 2016-08-20 LAB — OB RESULTS CONSOLE HEPATITIS B SURFACE ANTIGEN: HEP B S AG: NEGATIVE

## 2016-08-20 LAB — OB RESULTS CONSOLE HIV ANTIBODY (ROUTINE TESTING): HIV: NONREACTIVE

## 2016-08-20 LAB — OB RESULTS CONSOLE RUBELLA ANTIBODY, IGM: RUBELLA: IMMUNE

## 2016-09-12 ENCOUNTER — Encounter: Payer: Self-pay | Admitting: Nurse Practitioner

## 2016-09-12 ENCOUNTER — Ambulatory Visit (INDEPENDENT_AMBULATORY_CARE_PROVIDER_SITE_OTHER): Payer: BLUE CROSS/BLUE SHIELD | Admitting: Nurse Practitioner

## 2016-09-12 ENCOUNTER — Ambulatory Visit: Payer: Commercial Indemnity | Admitting: Internal Medicine

## 2016-09-12 VITALS — BP 116/70 | HR 98 | Temp 98.9°F | Ht 61.0 in | Wt 143.0 lb

## 2016-09-12 DIAGNOSIS — J019 Acute sinusitis, unspecified: Secondary | ICD-10-CM | POA: Diagnosis not present

## 2016-09-12 DIAGNOSIS — H66001 Acute suppurative otitis media without spontaneous rupture of ear drum, right ear: Secondary | ICD-10-CM | POA: Diagnosis not present

## 2016-09-12 LAB — POCT RAPID STREP A (OFFICE): Rapid Strep A Screen: NEGATIVE

## 2016-09-12 MED ORDER — CEFUROXIME AXETIL 250 MG PO TABS: 250.0000 mg | ORAL_TABLET | Freq: Two times a day (BID) | ORAL | 0 refills | Status: DC

## 2016-09-12 MED ORDER — NEOMYCIN-POLYMYXIN-HC 3.5-10000-1 OT SOLN: 4.0000 [drp] | Freq: Three times a day (TID) | OTIC | 0 refills | Status: DC

## 2016-09-12 MED ORDER — LORATADINE 10 MG PO TABS: 10.0000 mg | ORAL_TABLET | Freq: Every day | ORAL | 0 refills | Status: DC

## 2016-09-12 MED ORDER — FLUTICASONE PROPIONATE 50 MCG/ACT NA SUSP: 2.0000 | Freq: Every day | NASAL | 0 refills | Status: DC

## 2016-09-12 MED ORDER — MOMETASONE FUROATE 50 MCG/ACT NA SUSP: 2.0000 | Freq: Every day | NASAL | 0 refills | Status: DC

## 2016-09-12 NOTE — Patient Instructions (Signed)
Consider referral to ENT if no improvement in 1week.  Continue tylenol for pain.  Encourage adequate oral hydration.  Do not insert anything in ear.  Otitis Media, Adult Otitis media is redness, soreness, and puffiness (swelling) in the space just behind your eardrum (middle ear). It may be caused by allergies or infection. It often happens along with a cold. Follow these instructions at home:  Take your medicine as told. Finish it even if you start to feel better.  Only take over-the-counter or prescription medicines for pain, discomfort, or fever as told by your doctor.  Follow up with your doctor as told. Contact a doctor if:  You have otitis media only in one ear, or bleeding from your nose, or both.  You notice a lump on your neck.  You are not getting better in 3-5 days.  You feel worse instead of better. Get help right away if:  You have pain that is not helped with medicine.  You have puffiness, redness, or pain around your ear.  You get a stiff neck.  You cannot move part of your face (paralysis).  You notice that the bone behind your ear hurts when you touch it. This information is not intended to replace advice given to you by your health care provider. Make sure you discuss any questions you have with your health care provider. Document Released: 06/26/2007 Document Revised: 06/15/2015 Document Reviewed: 08/04/2012 Elsevier Interactive Patient Education  2017 [employer].

## 2016-09-12 NOTE — Progress Notes (Signed)
Subjective:  Patient ID: Jacqueline Skinner, female    DOB: 1980-11-01  Age: 36 y.o. MRN: [id number]  CC: Sore Throat (sore throat,sinus pressure,headahce,ears pain going for 1 wk. [redacted] wk pregnant. )   Sore Throat   This is a new problem. The current episode started in the past 7 days. The problem has been gradually worsening. The pain is worse on the right side. Associated symptoms include congestion, ear pain, headaches, a plugged ear sensation and swollen glands. Pertinent negatives include no abdominal pain, coughing, drooling, ear discharge, hoarse voice, shortness of breath, stridor or vomiting. She has had no exposure to strep or mono. She has tried acetaminophen for the symptoms. The treatment provided mild relief.    Outpatient Medications Prior to Visit  Medication Sig Dispense Refill  . amoxicillin-clavulanate (AUGMENTIN) 875-125 MG per tablet Take 1 tablet by mouth 2 (two) times daily. (Patient not taking: Reported on 09/12/2016) 20 tablet 0  . benzonatate (TESSALON) 200 MG capsule Take 1 capsule (200 mg total) by mouth 3 (three) times daily as needed for cough (swallow whole). (Patient not taking: Reported on 09/12/2016) 30 capsule 1  . drospirenone-ethinyl estradiol (YASMIN 28) 3-0.03 MG tablet Take 1 tablet by mouth daily.    Marland Kitchen guaiFENesin-codeine (ROBITUSSIN AC) 100-10 MG/5ML syrup Take 5 mLs by mouth 4 (four) times daily as needed for cough. (Patient not taking: Reported on 09/12/2016) 120 mL 0   No facility-administered medications prior to visit.     ROS See HPI  Objective:  BP 116/70   Pulse 98   Temp 98.9 F (37.2 C)   Ht 5\' 1"  (1.549 m)   Wt 143 lb (64.9 kg)   SpO2 99%   BMI 27.02 kg/m   BP Readings from Last 3 Encounters:  09/12/16 116/70  10/11/13 108/70  10/06/13 104/70    Wt Readings from Last 3 Encounters:  09/12/16 143 lb (64.9 kg)  10/11/13 122 lb (55.3 kg)  10/06/13 123 lb 8 oz (56 kg)    Physical Exam  Constitutional: She is oriented to  person, place, and time. No distress.  HENT:  Right Ear: Ear canal normal. There is tenderness. No mastoid tenderness. Tympanic membrane is injected, erythematous and bulging. A middle ear effusion is present.  Left Ear: External ear and ear canal normal. No tenderness. No mastoid tenderness. Tympanic membrane is not injected, not erythematous and not bulging. A middle ear effusion is present.  Nose: Mucosal edema and rhinorrhea present. Right sinus exhibits maxillary sinus tenderness. Right sinus exhibits no frontal sinus tenderness. Left sinus exhibits maxillary sinus tenderness. Left sinus exhibits no frontal sinus tenderness.  Mouth/Throat: Uvula is midline. No trismus in the jaw. Posterior oropharyngeal erythema present. No oropharyngeal exudate.  Eyes: No scleral icterus.  Neck: Normal range of motion. Neck supple.  Cardiovascular: Normal rate and normal heart sounds.   Pulmonary/Chest: Effort normal and breath sounds normal.  Musculoskeletal: She exhibits no edema.  Lymphadenopathy:    She has cervical adenopathy.  Neurological: She is alert and oriented to person, place, and time.  Vitals reviewed.   Lab Results  Component Value Date   WBC 10.8 (H) 09/26/2006   HGB 11.9 (L) 09/26/2006   HCT 34.6 (L) 09/26/2006   PLT 209 09/26/2006    Dg Finger Thumb Right  Result Date: 03/18/2012 *RADIOLOGY REPORT* Clinical Data: Trauma, rule out thumb fracture RIGHT THUMB 2+V Comparison: None. Findings: Three views of the right thumb submitted.  No acute fracture or subluxation.  No  radiopaque foreign body. IMPRESSION: No acute fracture or subluxation. Original Report Authenticated By: Natasha Mead, M.D.    Assessment & Plan:   Tasnim was seen today for sore throat.  Diagnoses and all orders for this visit:  Acute suppurative otitis media of right ear without spontaneous rupture of tympanic membrane, recurrence not specified -     neomycin-polymyxin-hydrocortisone (CORTISPORIN) OTIC  solution; Place 4 drops into the right ear 3 (three) times daily. -     cefUROXime (CEFTIN) 250 MG tablet; Take 1 tablet (250 mg total) by mouth 2 (two) times daily with a meal.  Acute rhinosinusitis -     POCT rapid strep A -     loratadine (CLARITIN) 10 MG tablet; Take 1 tablet (10 mg total) by mouth daily. -     Discontinue: mometasone (NASONEX) 50 MCG/ACT nasal spray; Place 2 sprays into the nose daily. -     cefUROXime (CEFTIN) 250 MG tablet; Take 1 tablet (250 mg total) by mouth 2 (two) times daily with a meal. -     fluticasone (FLONASE) 50 MCG/ACT nasal spray; Place 2 sprays into both nostrils daily.   I have discontinued Ms. Bunten's drospirenone-ethinyl estradiol, benzonatate, guaiFENesin-codeine, amoxicillin-clavulanate, and mometasone. I am also having her start on loratadine, neomycin-polymyxin-hydrocortisone, cefUROXime, and fluticasone. Additionally, I am having her maintain her acetaminophen and Prenatal Vit-Fe Fumarate-FA (PRENATAL VITAMIN PO).  Meds ordered this encounter  Medications  . acetaminophen (TYLENOL) 500 MG tablet    Sig: Take 500 mg by mouth every 6 (six) hours as needed.  . Prenatal Vit-Fe Fumarate-FA (PRENATAL VITAMIN PO)    Sig: Take by mouth.  . loratadine (CLARITIN) 10 MG tablet    Sig: Take 1 tablet (10 mg total) by mouth daily.    Dispense:  14 tablet    Refill:  0    Order Specific Question:   Supervising Provider    Answer:   Tresa Garter [[id number]]  . DISCONTD: mometasone (NASONEX) 50 MCG/ACT nasal spray    Sig: Place 2 sprays into the nose daily.    Dispense:  17 g    Refill:  0    Order Specific Question:   Supervising Provider    Answer:   Tresa Garter [[id number]]  . neomycin-polymyxin-hydrocortisone (CORTISPORIN) OTIC solution    Sig: Place 4 drops into the right ear 3 (three) times daily.    Dispense:  10 mL    Refill:  0    Order Specific Question:   Supervising Provider    Answer:   Tresa Garter [[id number]]  . cefUROXime  (CEFTIN) 250 MG tablet    Sig: Take 1 tablet (250 mg total) by mouth 2 (two) times daily with a meal.    Dispense:  20 tablet    Refill:  0    Order Specific Question:   Supervising Provider    Answer:   Tresa Garter [[id number]]  . fluticasone (FLONASE) 50 MCG/ACT nasal spray    Sig: Place 2 sprays into both nostrils daily.    Dispense:  16 g    Refill:  0    To replace nasonex prescription.    Order Specific Question:   Supervising Provider    Answer:   Tresa Garter [[id number]]    Follow-up: Return in about 1 week (around 09/19/2016) for right ear infection.  Alysia Penna, NP

## 2016-09-20 ENCOUNTER — Ambulatory Visit (INDEPENDENT_AMBULATORY_CARE_PROVIDER_SITE_OTHER): Payer: BLUE CROSS/BLUE SHIELD | Admitting: Nurse Practitioner

## 2016-09-20 ENCOUNTER — Encounter: Payer: Self-pay | Admitting: Nurse Practitioner

## 2016-09-20 VITALS — BP 110/70 | HR 87 | Temp 98.4°F | Ht 61.0 in | Wt 143.0 lb

## 2016-09-20 DIAGNOSIS — J019 Acute sinusitis, unspecified: Secondary | ICD-10-CM | POA: Diagnosis not present

## 2016-09-20 DIAGNOSIS — H66001 Acute suppurative otitis media without spontaneous rupture of ear drum, right ear: Secondary | ICD-10-CM

## 2016-09-20 NOTE — Patient Instructions (Addendum)
complete oral abx as prescribed.  Continue flonase and claritin for another 1week then stop.  If symptom re occur, return to office.  May use robitussin for cough.  Push adequate oral hydration  Consider referral to ENT if symptoms return

## 2016-09-20 NOTE — Progress Notes (Signed)
Subjective:  Patient ID: Jacqueline Skinner, female    DOB: 11-19-1980  Age: 36 y.o. MRN: [id number]  CC: Follow-up (still has some cough,ear still feel swelling --cant hear 100%/ med consult. )   HPI [redacted]weeks pregnant. Mrs. Pomerleau is accompnaien by husband. She is here for reassessment of right ear infection. Report some improvement in symptoms fur still has right ear fullness. No ear pain. Has not completed oral abx and still using flonase and claritin. No fever, no ear drainage. No neck pain. No dizziness.  Outpatient Medications Prior to Visit  Medication Sig Dispense Refill  . acetaminophen (TYLENOL) 500 MG tablet Take 500 mg by mouth every 6 (six) hours as needed.    . cefUROXime (CEFTIN) 250 MG tablet Take 1 tablet (250 mg total) by mouth 2 (two) times daily with a meal. 20 tablet 0  . fluticasone (FLONASE) 50 MCG/ACT nasal spray Place 2 sprays into both nostrils daily. 16 g 0  . loratadine (CLARITIN) 10 MG tablet Take 1 tablet (10 mg total) by mouth daily. 14 tablet 0  . neomycin-polymyxin-hydrocortisone (CORTISPORIN) OTIC solution Place 4 drops into the right ear 3 (three) times daily. 10 mL 0  . Prenatal Vit-Fe Fumarate-FA (PRENATAL VITAMIN PO) Take by mouth.     No facility-administered medications prior to visit.     ROS See HPI  Objective:  BP 110/70   Pulse 87   Temp 98.4 F (36.9 C)   Ht 5\' 1"  (1.549 m)   Wt 143 lb (64.9 kg)   SpO2 100%   BMI 27.02 kg/m   BP Readings from Last 3 Encounters:  09/20/16 110/70  09/12/16 116/70  10/11/13 108/70    Wt Readings from Last 3 Encounters:  09/20/16 143 lb (64.9 kg)  09/12/16 143 lb (64.9 kg)  10/11/13 122 lb (55.3 kg)    Physical Exam  Constitutional: No distress.  HENT:  Right Ear: External ear and ear canal normal. No drainage, swelling or tenderness. No mastoid tenderness. Tympanic membrane is injected. Tympanic membrane is not erythematous, not retracted and not bulging. A middle ear effusion is  present.  Left Ear: Tympanic membrane, external ear and ear canal normal. No drainage or tenderness. No mastoid tenderness. Tympanic membrane is not erythematous.  No middle ear effusion.  Nose: Nose normal.  Mouth/Throat: Oropharynx is clear and moist. No oropharyngeal exudate.  Eyes: Pupils are equal, round, and reactive to light. Conjunctivae and EOM are normal. No scleral icterus.  Neck: Normal range of motion. Neck supple.  Cardiovascular: Normal rate.   Pulmonary/Chest: Effort normal.  Lymphadenopathy:    She has no cervical adenopathy.  Vitals reviewed.   Lab Results  Component Value Date   WBC 10.8 (H) 09/26/2006   HGB 11.9 (L) 09/26/2006   HCT 34.6 (L) 09/26/2006   PLT 209 09/26/2006    Dg Finger Thumb Right  Result Date: 03/18/2012 *RADIOLOGY REPORT* Clinical Data: Trauma, rule out thumb fracture RIGHT THUMB 2+V Comparison: None. Findings: Three views of the right thumb submitted.  No acute fracture or subluxation.  No radiopaque foreign body. IMPRESSION: No acute fracture or subluxation. Original Report Authenticated By: Natasha Mead, M.D.    Assessment & Plan:   Kassidie was seen today for follow-up.  Diagnoses and all orders for this visit:  Acute suppurative otitis media of right ear without spontaneous rupture of tympanic membrane, recurrence not specified  Acute rhinosinusitis   I am having Ms. Richart maintain her acetaminophen, Prenatal Vit-Fe Fumarate-FA (PRENATAL VITAMIN PO),  loratadine, neomycin-polymyxin-hydrocortisone, cefUROXime, and fluticasone.  No orders of the defined types were placed in this encounter.   Follow-up: Return if symptoms worsen or fail to improve.  Alysia Pennaharlotte Yohann Curl, NP

## 2017-01-21 NOTE — L&D Delivery Note (Signed)
Delivery Note Pt progressed quickly to C/C/+2 pushed very well for delivery (took a break in pushing for another precipitous delivery) Last several minutes of pushing heart rate 70-80's - with recovery.  At 4:38 PM a viable and healthy female was delivered via Vaginal, Spontaneous (Presentation: OA; ROT ).  APGAR: 8, 9; weight P .   Placenta status: delivered, intact .  Cord: 3V with the following complications: none .    Anesthesia:  epidural Episiotomy: None Lacerations: 2nd degree;Periurethral Suture Repair: 3.0 vicryl rapide Est. Blood Loss (mL): 300cc  Mom to postpartum.  Baby to Couplet care / Skin to Skin.  Chenell Lozon Bovard-Stuckert 03/07/2017, 5:22 PM  O+/Tdap in PNC/RI/Br/Contra?  D/w pt circumcision for female infant inc r/b/a - will proceed

## 2017-02-18 LAB — OB RESULTS CONSOLE GBS: GBS: NEGATIVE

## 2017-03-04 ENCOUNTER — Encounter (HOSPITAL_COMMUNITY): Payer: Self-pay | Admitting: *Deleted

## 2017-03-04 ENCOUNTER — Other Ambulatory Visit: Payer: Self-pay

## 2017-03-04 ENCOUNTER — Inpatient Hospital Stay (HOSPITAL_COMMUNITY)
Admission: AD | Admit: 2017-03-04 | Discharge: 2017-03-04 | Disposition: A | Discharge status: Home / Self Care | Payer: BLUE CROSS/BLUE SHIELD | Source: Ambulatory Visit | Attending: Obstetrics and Gynecology | Admitting: Obstetrics and Gynecology

## 2017-03-04 DIAGNOSIS — O471 False labor at or after 37 completed weeks of gestation: Secondary | ICD-10-CM

## 2017-03-04 LAB — POCT FERN TEST: POCT Fern Test: NEGATIVE

## 2017-03-04 NOTE — MAU Note (Signed)
Pt reports ? Leaking fluid for 2 days, not all the time. Occasional uc's

## 2017-03-04 NOTE — MAU Provider Note (Signed)
S: Ms. Jacqueline Skinner is a 37 y.o. G2P1001 at 362w5d  who presents to MAU today complaining of leaking of fluid since yesterday while at work. She reports that she was at work squatting and felt something run down her leg. Did not see any fluid after and denies wearing a pad, liner or having to change underwear. She reports being at work today and the same occurred with fluid running down her leg. Denies underwear being soaked or gush of fluid. She denies vaginal bleeding. She endorses contractions. She reports normal fetal movement.    O: BP 127/78 (BP Location: Right Arm)   Pulse 79   Temp 99.3 F (37.4 C) (Oral)   Resp 16   Ht 5\' 1"  (1.549 m)   Wt 171 lb (77.6 kg)   SpO2 100%   BMI 32.31 kg/m  GENERAL: Well-developed, well-nourished female in no acute distress.  HEAD: Normocephalic, atraumatic.  CHEST: Normal effort of breathing, regular heart rate ABDOMEN: Soft, nontender, gravid PELVIC: Normal external female genitalia. Vagina is pink and rugated. Cervix with normal contour, no lesions. Normal discharge.  Negative pooling.   Cervical exam:  Dilation: 3 Effacement (%): 50 Presentation: Vertex Exam by:: Jacqueline Skinner, CNM    Fetal Monitoring: Baseline: 135 Variability: moderate  Accelerations: present  Decelerations: none Contractions: 2-7 minutes/ mild-moderate by palpation    A: SIUP at 792w5d  Membranes intact  P: Report given to RN to contact MD on call for further instructions  Sharyon CableRogers, Reinhard Schack C, CNM 03/04/2017 7:21 PM

## 2017-03-04 NOTE — MAU Note (Addendum)
..   I have communicated with Dr. Senaida Oresichardson and reviewed vital signs:  Vitals:   03/04/17 1837 03/04/17 1937  BP: 127/78 124/67  Pulse: 79 89  Resp: 16   Temp: 99.3 F (37.4 C)   SpO2: 100%     Vaginal exam:  Dilation: 3 Effacement (%): 50 Presentation: Vertex Exam by:: Veronica, CNM ,   Also reviewed contraction pattern and that non-stress test is reactive.  It has been documented that patient is contracting every 4-7 minutes with minimal cervical change, not indicating active labor.  Patient denies any other complaints.  Based on this report provider has given order for discharge.  A discharge order and diagnosis entered by a provider.   Labor discharge instructions reviewed with patient.

## 2017-03-04 NOTE — MAU Note (Signed)
Fern negative. Pt. Ready for discharge.

## 2017-03-07 ENCOUNTER — Other Ambulatory Visit: Payer: Self-pay

## 2017-03-07 ENCOUNTER — Inpatient Hospital Stay (HOSPITAL_COMMUNITY): Payer: BLUE CROSS/BLUE SHIELD | Admitting: Anesthesiology

## 2017-03-07 ENCOUNTER — Inpatient Hospital Stay (HOSPITAL_COMMUNITY)
Admission: AD | Admit: 2017-03-07 | Discharge: 2017-03-09 | DRG: 807 | Disposition: A | Discharge status: Home / Self Care | Payer: BLUE CROSS/BLUE SHIELD | Source: Ambulatory Visit | Attending: Obstetrics and Gynecology | Admitting: Obstetrics and Gynecology

## 2017-03-07 ENCOUNTER — Encounter (HOSPITAL_COMMUNITY): Payer: Self-pay

## 2017-03-07 DIAGNOSIS — K219 Gastro-esophageal reflux disease without esophagitis: Secondary | ICD-10-CM | POA: Diagnosis present

## 2017-03-07 DIAGNOSIS — E669 Obesity, unspecified: Secondary | ICD-10-CM | POA: Diagnosis present

## 2017-03-07 DIAGNOSIS — Z87891 Personal history of nicotine dependence: Secondary | ICD-10-CM

## 2017-03-07 DIAGNOSIS — Z3A38 38 weeks gestation of pregnancy: Secondary | ICD-10-CM | POA: Diagnosis not present

## 2017-03-07 DIAGNOSIS — O99214 Obesity complicating childbirth: Secondary | ICD-10-CM | POA: Diagnosis present

## 2017-03-07 DIAGNOSIS — O9962 Diseases of the digestive system complicating childbirth: Secondary | ICD-10-CM | POA: Diagnosis present

## 2017-03-07 DIAGNOSIS — O4292 Full-term premature rupture of membranes, unspecified as to length of time between rupture and onset of labor: Principal | ICD-10-CM | POA: Diagnosis present

## 2017-03-07 DIAGNOSIS — O4290 Premature rupture of membranes, unspecified as to length of time between rupture and onset of labor, unspecified weeks of gestation: Secondary | ICD-10-CM

## 2017-03-07 HISTORY — DX: Premature rupture of membranes, unspecified as to length of time between rupture and onset of labor, unspecified weeks of gestation: O42.90

## 2017-03-07 LAB — CBC
HCT: 37 % (ref 36.0–46.0)
Hemoglobin: 12.3 g/dL (ref 12.0–15.0)
MCH: 28.5 pg (ref 26.0–34.0)
MCHC: 33.2 g/dL (ref 30.0–36.0)
MCV: 85.8 fL (ref 78.0–100.0)
PLATELETS: 237 10*3/uL (ref 150–400)
RBC: 4.31 MIL/uL (ref 3.87–5.11)
RDW: 14.2 % (ref 11.5–15.5)
WBC: 6.8 10*3/uL (ref 4.0–10.5)

## 2017-03-07 LAB — TYPE AND SCREEN
ABO/RH(D): O POS
ANTIBODY SCREEN: NEGATIVE

## 2017-03-07 LAB — AMNISURE RUPTURE OF MEMBRANE (ROM) NOT AT ARMC: Amnisure ROM: POSITIVE

## 2017-03-07 LAB — ABO/RH: ABO/RH(D): O POS

## 2017-03-07 MED ORDER — OXYCODONE HCL 5 MG PO TABS: 5.0000 mg | ORAL_TABLET | ORAL | Status: DC | PRN

## 2017-03-07 MED ORDER — ZOLPIDEM TARTRATE 5 MG PO TABS: 5.0000 mg | ORAL_TABLET | Freq: Every evening | ORAL | Status: DC | PRN

## 2017-03-07 MED ORDER — LIDOCAINE HCL (PF) 1 % IJ SOLN
30.0000 mL | INTRAMUSCULAR | Status: DC | PRN
  Administered 2017-03-07: 30 mL via SUBCUTANEOUS
  Filled 2017-03-07: qty 30

## 2017-03-07 MED ORDER — SENNOSIDES-DOCUSATE SODIUM 8.6-50 MG PO TABS
2.0000 | ORAL_TABLET | ORAL | Status: DC
  Administered 2017-03-07 – 2017-03-08 (×2): 2 via ORAL
  Filled 2017-03-07 (×2): qty 2

## 2017-03-07 MED ORDER — OXYCODONE-ACETAMINOPHEN 5-325 MG PO TABS: 1.0000 | ORAL_TABLET | ORAL | Status: DC | PRN

## 2017-03-07 MED ORDER — SODIUM CHLORIDE 0.9 % IV SOLN
2.0000 g | Freq: Two times a day (BID) | INTRAVENOUS | Status: AC
  Administered 2017-03-07 – 2017-03-08 (×2): 2 g via INTRAVENOUS
  Filled 2017-03-07 (×2): qty 2

## 2017-03-07 MED ORDER — SOD CITRATE-CITRIC ACID 500-334 MG/5ML PO SOLN
30.0000 mL | ORAL | Status: DC | PRN
  Administered 2017-03-07: 30 mL via ORAL
  Filled 2017-03-07: qty 15

## 2017-03-07 MED ORDER — DIBUCAINE 1 % RE OINT: 1.0000 "application " | TOPICAL_OINTMENT | RECTAL | Status: DC | PRN

## 2017-03-07 MED ORDER — OXYCODONE-ACETAMINOPHEN 5-325 MG PO TABS: 2.0000 | ORAL_TABLET | ORAL | Status: DC | PRN

## 2017-03-07 MED ORDER — MISOPROSTOL 200 MCG PO TABS
800.0000 ug | ORAL_TABLET | Freq: Once | ORAL | Status: AC
  Administered 2017-03-07: 800 ug via RECTAL

## 2017-03-07 MED ORDER — OXYTOCIN BOLUS FROM INFUSION
500.0000 mL | Freq: Once | INTRAVENOUS | Status: AC
  Administered 2017-03-07: 500 mL via INTRAVENOUS

## 2017-03-07 MED ORDER — LIDOCAINE HCL (PF) 1 % IJ SOLN
INTRAMUSCULAR | Status: DC | PRN
  Administered 2017-03-07: 4 mL via EPIDURAL
  Administered 2017-03-07: 3 mL via EPIDURAL

## 2017-03-07 MED ORDER — CALCIUM CARBONATE ANTACID 500 MG PO CHEW: 1.0000 | CHEWABLE_TABLET | Freq: Three times a day (TID) | ORAL | Status: DC | PRN

## 2017-03-07 MED ORDER — DIPHENHYDRAMINE HCL 25 MG PO CAPS: 25.0000 mg | ORAL_CAPSULE | Freq: Four times a day (QID) | ORAL | Status: DC | PRN

## 2017-03-07 MED ORDER — EPHEDRINE 5 MG/ML INJ
10.0000 mg | INTRAVENOUS | Status: DC | PRN
  Filled 2017-03-07: qty 2

## 2017-03-07 MED ORDER — BUTORPHANOL TARTRATE 1 MG/ML IJ SOLN: 1.0000 mg | INTRAMUSCULAR | Status: DC | PRN

## 2017-03-07 MED ORDER — BENZOCAINE-MENTHOL 20-0.5 % EX AERO
1.0000 "application " | INHALATION_SPRAY | CUTANEOUS | Status: DC | PRN
  Administered 2017-03-07: 1 via TOPICAL
  Filled 2017-03-07: qty 56

## 2017-03-07 MED ORDER — DIPHENHYDRAMINE HCL 50 MG/ML IJ SOLN: 12.5000 mg | INTRAMUSCULAR | Status: DC | PRN

## 2017-03-07 MED ORDER — WITCH HAZEL-GLYCERIN EX PADS: 1.0000 "application " | MEDICATED_PAD | CUTANEOUS | Status: DC | PRN

## 2017-03-07 MED ORDER — COCONUT OIL OIL
1.0000 "application " | TOPICAL_OIL | Status: DC | PRN
  Filled 2017-03-07: qty 120

## 2017-03-07 MED ORDER — LACTATED RINGERS IV SOLN: 500.0000 mL | Freq: Once | INTRAVENOUS | Status: DC

## 2017-03-07 MED ORDER — ACETAMINOPHEN 325 MG PO TABS: 650.0000 mg | ORAL_TABLET | ORAL | Status: DC | PRN

## 2017-03-07 MED ORDER — FENTANYL 2.5 MCG/ML BUPIVACAINE 1/10 % EPIDURAL INFUSION (WH - ANES)
14.0000 mL/h | INTRAMUSCULAR | Status: DC | PRN
  Administered 2017-03-07: 11 mL/h via EPIDURAL
  Filled 2017-03-07: qty 100

## 2017-03-07 MED ORDER — OXYTOCIN 40 UNITS IN LACTATED RINGERS INFUSION - SIMPLE MED
2.5000 [IU]/h | INTRAVENOUS | Status: DC
  Filled 2017-03-07: qty 1000

## 2017-03-07 MED ORDER — PRENATAL MULTIVITAMIN CH
1.0000 | ORAL_TABLET | Freq: Every day | ORAL | Status: DC
  Administered 2017-03-08: 1 via ORAL
  Filled 2017-03-07: qty 1

## 2017-03-07 MED ORDER — ONDANSETRON HCL 4 MG/2ML IJ SOLN: 4.0000 mg | Freq: Four times a day (QID) | INTRAMUSCULAR | Status: DC | PRN

## 2017-03-07 MED ORDER — OXYCODONE HCL 5 MG PO TABS: 10.0000 mg | ORAL_TABLET | ORAL | Status: DC | PRN

## 2017-03-07 MED ORDER — ONDANSETRON HCL 4 MG PO TABS: 4.0000 mg | ORAL_TABLET | ORAL | Status: DC | PRN

## 2017-03-07 MED ORDER — LACTATED RINGERS IV SOLN: 500.0000 mL | INTRAVENOUS | Status: DC | PRN

## 2017-03-07 MED ORDER — PHENYLEPHRINE 40 MCG/ML (10ML) SYRINGE FOR IV PUSH (FOR BLOOD PRESSURE SUPPORT)
80.0000 ug | PREFILLED_SYRINGE | INTRAVENOUS | Status: DC | PRN
  Filled 2017-03-07: qty 5

## 2017-03-07 MED ORDER — MISOPROSTOL 200 MCG PO TABS
ORAL_TABLET | ORAL | Status: AC
  Filled 2017-03-07: qty 4

## 2017-03-07 MED ORDER — PHENYLEPHRINE 40 MCG/ML (10ML) SYRINGE FOR IV PUSH (FOR BLOOD PRESSURE SUPPORT)
80.0000 ug | PREFILLED_SYRINGE | INTRAVENOUS | Status: DC | PRN
  Filled 2017-03-07: qty 5
  Filled 2017-03-07: qty 10

## 2017-03-07 MED ORDER — ACETAMINOPHEN 500 MG PO TABS
1000.0000 mg | ORAL_TABLET | Freq: Once | ORAL | Status: AC
  Administered 2017-03-07: 1000 mg via ORAL
  Filled 2017-03-07: qty 2

## 2017-03-07 MED ORDER — SIMETHICONE 80 MG PO CHEW
80.0000 mg | CHEWABLE_TABLET | ORAL | Status: DC | PRN
  Filled 2017-03-07: qty 1

## 2017-03-07 MED ORDER — LACTATED RINGERS IV SOLN
500.0000 mL | Freq: Once | INTRAVENOUS | Status: AC
  Administered 2017-03-07: 500 mL via INTRAVENOUS

## 2017-03-07 MED ORDER — IBUPROFEN 600 MG PO TABS
600.0000 mg | ORAL_TABLET | Freq: Four times a day (QID) | ORAL | Status: DC
  Administered 2017-03-07 – 2017-03-09 (×6): 600 mg via ORAL
  Filled 2017-03-07 (×6): qty 1

## 2017-03-07 MED ORDER — LACTATED RINGERS IV SOLN
INTRAVENOUS | Status: DC
  Administered 2017-03-07: 15:00:00 via INTRAVENOUS

## 2017-03-07 MED ORDER — ONDANSETRON HCL 4 MG/2ML IJ SOLN: 4.0000 mg | INTRAMUSCULAR | Status: DC | PRN

## 2017-03-07 MED ORDER — LACTATED RINGERS IV SOLN: INTRAVENOUS | Status: DC

## 2017-03-07 NOTE — Anesthesia Preprocedure Evaluation (Addendum)
Anesthesia Evaluation  Patient identified by MRN, date of birth, ID band Patient awake    Reviewed: Allergy & Precautions, NPO status , Patient's Chart, lab work & pertinent test results  Airway Mallampati: II  TM Distance: >3 FB Neck ROM: Full    Dental no notable dental hx. (+) Teeth Intact   Pulmonary former smoker,    Pulmonary exam normal breath sounds clear to auscultation       Cardiovascular negative cardio ROS Normal cardiovascular exam Rhythm:Regular Rate:Normal     Neuro/Psych  Neuromuscular disease    GI/Hepatic GERD  Medicated and Controlled,  Endo/Other  Obesity  Renal/GU   negative genitourinary   Musculoskeletal  (+) Arthritis , Osteoarthritis,  Sciatica Bilateral Sacroiliitis   Abdominal (+) + obese,   Peds  Hematology negative hematology ROS (+)   Anesthesia Other Findings   Reproductive/Obstetrics (+) Pregnancy                            Anesthesia Physical Anesthesia Plan  ASA: II  Anesthesia Plan: Epidural   Post-op Pain Management:    Induction:   PONV Risk Score and Plan:   Airway Management Planned: Natural Airway  Additional Equipment:   Intra-op Plan:   Post-operative Plan:   Informed Consent: I have reviewed the patients History and Physical, chart, labs and discussed the procedure including the risks, benefits and alternatives for the proposed anesthesia with the patient or authorized representative who has indicated his/her understanding and acceptance.   Dental advisory given  Plan Discussed with: CRNA, Anesthesiologist and Surgeon  Anesthesia Plan Comments:        Anesthesia Quick Evaluation

## 2017-03-07 NOTE — MAU Note (Signed)
Pt presents to MAU from OB office with PROM. Pt has felt slow leaking of fluid for 3 days. OB provider advised to come to MAU for amnisure. Pt has had VB today after having membranes striped in office. +FM

## 2017-03-07 NOTE — MAU Note (Signed)
Pt sent from MD office for ROM eval.  Dr. Ellyn HackBovard request amnisure be done.

## 2017-03-07 NOTE — H&P (Addendum)
Jacqueline Skinner is a 37 y.o. female [phone] at 38+ with SROM.  Has been leaking off and on for several days.  On exam in office, SSE +/- nitrazine, +/- pool, neg fern; increased fluid and VB with SVE; amnisure positive.  Had previa in pregnancy, resolved.  Received Tdap in [facility] Dated by LMP c/w early [place].  Pregnancy complicated by AMA.     OB History    Gravida Para Term Preterm AB Living   2 1 1     1    SAB TAB Ectopic Multiple Live Births           1    SAB x 2 SVD 09/2006 female G4 present  + abn pap - had LEEP, last ASCUS H RHPV neg No STD  Past Medical History:  Diagnosis Date  . 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 PROM (premature rupture of membranes) 03/07/2017   Past Surgical History:  Procedure Laterality Date  . LEEP  2017   abnormal pap x2 with HR HPV, coplo LGSIL, rare cells +HGSIL (Dr Ellyn Hack)   Family History: pancreatic CA, breast CA, DM Social History:  reports that she has quit smoking. she has never used smokeless tobacco. She reports that she drinks alcohol. She reports that she does not use drugs. married  Meds loratidine, PNV All NKDA     Maternal Diabetes: No Genetic Screening: Normal Maternal Ultrasounds/Referrals: Normal Fetal Ultrasounds or other Referrals:  None Maternal Substance Abuse:  No Significant Maternal Medications:  None Significant Maternal Lab Results:  Lab values include: Group B Strep negative Other Comments:  None  Review of Systems  Constitutional: Negative.   HENT: Negative.   Eyes: Negative.   Respiratory: Negative.   Cardiovascular: Negative.   Gastrointestinal: Negative.   Genitourinary: Negative.   Musculoskeletal: Negative.   Skin: Negative.   Neurological: Negative.   Psychiatric/Behavioral: Negative.    Maternal Medical History:  Reason for admission: Rupture of membranes.   Contractions: Frequency: irregular.    Fetal activity: Perceived  fetal activity is normal.    Prenatal Complications - Diabetes: none.    Dilation: 3.5 Effacement (%): 90, 100 Station: -1 Exam by:: stone rnc Blood pressure 137/90, pulse 87, temperature 98.9 F (37.2 C), temperature source Oral, resp. rate 18, height 5\' 1"  (1.549 m), weight 77.1 kg (170 lb). Maternal Exam:  Abdomen: Patient reports no abdominal tenderness. Fundal height is appropriate for gestation.   Estimated fetal weight is 7-8#.   Fetal presentation: vertex  Introitus: Normal vulva. Normal vagina.  Cervix: Cervix evaluated by digital exam.     Physical Exam  Constitutional: She is oriented to person, place, and time. She appears well-developed and well-nourished.  HENT:  Head: Normocephalic and atraumatic.  Cardiovascular: Normal rate and regular rhythm.  Respiratory: Effort normal and breath sounds normal. No respiratory distress. She has no wheezes.  GI: Soft. Bowel sounds are normal. She exhibits no distension. There is no tenderness.  Musculoskeletal: Normal range of motion.  Neurological: She is alert and oriented to person, place, and time.  Skin: Skin is warm and dry.  Psychiatric: She has a normal mood and affect. Her behavior is normal.    Prenatal labs: ABO, Rh: O/Positive/-- (07/31 0000) Antibody: Negative (07/31 0000) Rubella: Immune (07/31 0000) RPR: Nonreactive (07/31 0000)  HBsAg: Negative (07/31 0000)  HIV: Non-reactive (07/31 0000)  GBS: Negative (01/29 0000)    EDC by LMP c/w1st  trimester US Nl anat, low lying plac, female F/u nl placenta  Hgb 13.0/Plt 336/Chl neg/GC neg/ Varicella immune/glucola 113 Tdap 12/5  Assessment/Plan: 36yo G4P1021 at 38+ with SROM for IOL Epidural, IV pain meds and nitrous prn Expect SVD  Hospital circumcision paid  Jacqueline Skinner 03/07/2017, 12:54 PM

## 2017-03-07 NOTE — Anesthesia Procedure Notes (Signed)
Epidural Patient location during procedure: OB Start time: 03/07/2017 1:45 PM  Staffing Anesthesiologist: Mal AmabileFoster, Steffanie Mingle, MD Performed: anesthesiologist   Preanesthetic Checklist Completed: patient identified, site marked, surgical consent, pre-op evaluation, timeout performed, IV checked, risks and benefits discussed and monitors and equipment checked  Epidural Patient position: sitting Prep: site prepped and draped and DuraPrep Patient monitoring: continuous pulse ox and blood pressure Approach: midline Location: L3-L4 Injection technique: LOR air  Needle:  Needle type: Tuohy  Needle gauge: 17 G Needle length: 9 cm and 9 Needle insertion depth: 4 cm Catheter type: closed end flexible Catheter size: 19 Gauge Catheter at skin depth: 9 cm Test dose: negative and Other  Assessment Events: blood not aspirated, injection not painful, no injection resistance, negative IV test and no paresthesia  Additional Notes Patient identified. Risks and benefits discussed including failed block, incomplete  Pain control, post dural puncture headache, nerve damage, paralysis, blood pressure Changes, nausea, vomiting, reactions to medications-both toxic and allergic and post Partum back pain. All questions were answered. Patient expressed understanding and wished to proceed. Sterile technique was used throughout procedure. Epidural site was Dressed with sterile barrier dressing. No paresthesias, signs of intravascular injection Or signs of intrathecal spread were encountered.  Patient was more comfortable after the epidural was dosed. Please see RN's note for documentation of vital signs and FHR which are stable.

## 2017-03-07 NOTE — Anesthesia Pain Management Evaluation Note (Signed)
  CRNA Pain Management Visit Note  Patient: Jacqueline SettleStacy Bowman Skinner, 37 y.o., female  "Hello I am a [facility] of the [facility]. We have an anesthesia team available at all times to provide care throughout the hospital, including epidural management and anesthesia for C-section. I don't know your plan for the delivery whether it a natural birth, water birth, IV sedation, nitrous supplementation, doula or epidural, but we want to meet your pain goals."   1.Was your pain managed to your expectations on prior hospitalizations?   Yes   2.What is your expectation for pain management during this hospitalization?     Epidural  3.How can we help you reach that goal? Pt recently had epidural placed and was very comfortable  Record the patient's initial score and the patient's pain goal.   Pain: 0  Pain Goal: 0 [facility] you to be able to say your pain was always managed very well.  Jontae Adebayo 03/07/2017

## 2017-03-08 LAB — CBC
HCT: 29.5 % — ABNORMAL LOW (ref 36.0–46.0)
Hemoglobin: 10 g/dL — ABNORMAL LOW (ref 12.0–15.0)
MCH: 29.2 pg (ref 26.0–34.0)
MCHC: 33.9 g/dL (ref 30.0–36.0)
MCV: 86 fL (ref 78.0–100.0)
PLATELETS: 164 10*3/uL (ref 150–400)
RBC: 3.43 MIL/uL — ABNORMAL LOW (ref 3.87–5.11)
RDW: 14.6 % (ref 11.5–15.5)
WBC: 9.4 10*3/uL (ref 4.0–10.5)

## 2017-03-08 LAB — RPR: RPR: NONREACTIVE

## 2017-03-08 NOTE — Anesthesia Postprocedure Evaluation (Signed)
Anesthesia Post Note  Patient: Jacqueline SettleStacy Bowman Skinner  Procedure(s) Performed: AN AD HOC LABOR EPIDURAL     Patient location during evaluation: Mother Baby Anesthesia Type: Epidural Level of consciousness: awake and alert and oriented Pain management: satisfactory to patient Vital Signs Assessment: post-procedure vital signs reviewed and stable Respiratory status: respiratory function stable Cardiovascular status: stable Postop Assessment: no headache, no backache, epidural receding, patient able to bend at knees, no signs of nausea or vomiting and adequate PO intake Anesthetic complications: no    Last Vitals:  Vitals:   03/07/17 2300 03/08/17 1020  BP: (!) 114/55 112/66  Pulse: 92 71  Resp: 18 16  Temp: 36.7 C 36.8 C  SpO2: 99%     Last Pain:  Vitals:   03/08/17 1245  TempSrc:   PainSc: 3    Pain Goal:                 Khaleb Broz

## 2017-03-08 NOTE — Lactation Note (Signed)
This note was copied from a baby's chart. Lactation Consultation Note  Patient Name: Jacqueline Skinner Date: 03/08/2017 Reason for consult: Initial assessment;Early term 71-38.6wks  30 hours old early term female who is being exclusively BF by his mother; she's a P2. Mom was set up with a # 20 NS yesterday but the pump was never brought to the room, only the pump kit, there is no [employer] pump in her room; let RN know. Baby was already asleep when entering the room, but mom feels confident that baby is getting a better latch. Mom stated that BF is going better today without the NS and that baby has been able to latch on and sustain the latch without NS, she also heard swallows.  Her nipples are still slightly sore, left nipple looks intact upon examination and redness is observed on right nipple (tip) but no cracks, blisters or bleeding. Reviewed BF brochure, BF resources and BF diary, mom is aware of [place] services and will call PRN.  Maternal Data Formula Feeding for Exclusion: No Has patient been taught Hand Expression?: Yes Does the patient have breastfeeding experience prior to this delivery?: Yes  Feeding Feeding Type: Breast Fed Length of feed: 5 min  Interventions Interventions: Breast feeding basics reviewed;Hand express;Breast compression;Breast massage  Lactation Tools Discussed/Used Tools: Nipple Shields Nipple shield size: 20 WIC Program: No   Consult Status Consult Status: Follow-up Date: 03/09/17 Follow-up type: In-patient    Jacqueline Skinner 03/08/2017, 7:11 PM

## 2017-03-08 NOTE — Progress Notes (Signed)
Post Partum Day 1 Subjective: no complaints, up ad lib, voiding, tolerating PO and nl lochia, pain controlled.  Had a fever PP - ? length of ROM.  Cefoteran for 24hr  Objective: Blood pressure (!) 114/55, pulse 92, temperature 98 F (36.7 C), temperature source Oral, resp. rate 18, height 5\' 1"  (1.549 m), weight 77.1 kg (170 lb), SpO2 99 %, unknown if currently breastfeeding.  Physical Exam:  General: alert and no distress Lochia: appropriate Uterine Fundus: firm  Recent Labs    03/07/17 1218 03/08/17 0601  HGB 12.3 10.0*  HCT 37.0 29.5*    Assessment/Plan: Plan for discharge tomorrow, Breastfeeding and Lactation consult.  Routine PP care.  Second/last dose of abx this am for total of 24 hr.  Routine care.     LOS: 1 day   Jacqueline Skinner 03/08/2017, 8:38 AM

## 2017-03-09 MED ORDER — IBUPROFEN 600 MG PO TABS: 600.0000 mg | ORAL_TABLET | Freq: Four times a day (QID) | ORAL | 1 refills | Status: DC | PRN

## 2017-03-09 MED ORDER — PRENATAL VITAMIN 27-0.8 MG PO TABS: 1.0000 | ORAL_TABLET | Freq: Every day | ORAL | 3 refills | Status: DC

## 2017-03-09 NOTE — Progress Notes (Signed)
Post Partum Day 2 Subjective: no complaints, up ad lib, voiding, tolerating PO and nl lochia, pain controlled  Objective: Blood pressure 112/69, pulse 75, temperature 98.3 F (36.8 C), temperature source Oral, resp. rate 18, height 5\' 1"  (1.549 m), weight 77.1 kg (170 lb), SpO2 99 %, unknown if currently breastfeeding.  Physical Exam:  General: alert and no distress Lochia: appropriate Uterine Fundus: firm   Recent Labs    03/07/17 1218 03/08/17 0601  HGB 12.3 10.0*  HCT 37.0 29.5*    Assessment/Plan: Discharge home, Breastfeeding and Lactation consult.  D/c with motrin and PNV.  F/u 6 weeks.     LOS: 2 days   Martesha Niedermeier Bovard-Stuckert 03/09/2017, 8:28 AM

## 2017-03-09 NOTE — Discharge Summary (Addendum)
OB Discharge Summary     Patient Name: Jacqueline Skinner DOB: 1980/09/16 MRN: [id number]  Date of admission: 03/07/2017 Delivering MD: Sherian Rein   Date of discharge: 03/09/2017  Admitting diagnosis: 38.1WKS AMNISURE Intrauterine pregnancy: [redacted]w[redacted]d     Secondary diagnosis:  Principal Problem:   SVD (spontaneous vaginal delivery) Active Problems:   PROM (premature rupture of membranes)   Normal labor and delivery  Additional problems: N/A     Discharge diagnosis: Term Pregnancy Delivered                                                                                                Post partum procedures:none  Augmentation:  Pitocin  Complications: None  Hospital course:  Onset of Labor With Vaginal Delivery   37 y.o. yo [id number] at [redacted]w[redacted]d was admitted to the hospital 03/07/2017 for SROM.  Indication for induction: SROM.  Patient had an uncomplicated labor course as follows: Membrane Rupture Time/Date: 10:30 AM ,03/07/2017   Intrapartum Procedures: Episiotomy: None [1]                                         Lacerations:  2nd degree [3];Periurethral [8]  Patient had delivery of a Viable infant.  Information for the patient's newborn:  Connee, Ikner [[id number]]  Delivery Method: Vaginal, Spontaneous(Filed from Delivery Summary)   03/07/2017  Details of delivery can be found in separate delivery note.  Patient had a routine postpartum course. Patient is discharged home 03/09/17.  Physical exam  Vitals:   03/08/17 1020 03/08/17 1500 03/08/17 1700 03/09/17 0555  BP: 112/66  129/71 112/69  Pulse: 71  78 75  Resp: 16  18 18   Temp: 98.2 F (36.8 C) 98.7 F (37.1 C) 98.2 F (36.8 C) 98.3 F (36.8 C)  TempSrc:  Oral Oral Oral  SpO2:    99%  Weight:      Height:       General: alert and no distress Lochia: appropriate Uterine Fundus: firm  Labs: Lab Results  Component Value Date   WBC 9.4 03/08/2017   HGB 10.0 (L) 03/08/2017   HCT 29.5 (L) 03/08/2017   MCV 86.0 03/08/2017   PLT 164 03/08/2017   No flowsheet data found.  Discharge instruction: per After Visit Summary and "Baby and Me Booklet".  After visit meds:  Allergies as of 03/09/2017   No Known Allergies     Medication List    TAKE these medications   calcium carbonate 500 MG chewable tablet Commonly known as:  TUMS - dosed in mg elemental calcium Chew 1-2 tablets by mouth 3 (three) times daily as needed for indigestion or heartburn.   ferrous sulfate 325 (65 FE) MG tablet Take 325 mg by mouth daily with breakfast.   ibuprofen 600 MG tablet Commonly known as:  ADVIL,MOTRIN Take 1 tablet (600 mg total) by mouth every 6 (six) hours as needed.   Prenatal Vitamin 27-0.8 MG Tabs Take 1 capsule by mouth daily. What changed:  medication strength  how much to take       Diet: routine diet  Activity: Advance as tolerated. Pelvic rest for 6 weeks.   Outpatient follow up:6 weeks Follow up Appt:No future appointments. Follow up Visit:No Follow-up on file.  Postpartum contraception: Progesterone only pills and Undecided  Newborn Data: Live born female  Birth Weight: 8 lb 4.1 oz (3745 g) APGAR: 8, 9  Newborn Delivery   Birth date/time:  03/07/2017 16:38:00 Delivery type:  Vaginal, Spontaneous     Baby Feeding: Breast Disposition:home with mother   03/09/2017 Sherian ReinJody Bovard-Stuckert, MD

## 2017-03-09 NOTE — Lactation Note (Signed)
This note was copied from a baby's chart. Lactation Consultation Note  Patient Name: Jacqueline Skinner's Date: 03/09/2017 Reason for consult: Follow-up assessment;Infant weight loss(5% weight loss , )  Baby is 41 hours  LC reviewed and updated the doc flow sheets,  Per mom baby cluster fed last last night and nipples ar alittle tender.  Baby is latching both breast now without the Nipple Shield.  LC reviewed sore nipple and engorgement prevention and tx.  LC instructed  Mom on the use hand pump and the shells between feedings except when sleeping.  Per mom has a DEBP at home , [employer]( [place] )  Mother informed of post-discharge support and given phone number to the lactation department, including services for phone call assistance; out-patient appointments; and breastfeeding support group. List of other breastfeeding resources in the community given in the handout. Encouraged mother to call for problems or concerns related to breastfeeding.   Maternal Data    Feeding Feeding Type: Breast Fed Length of feed: 13 min  LATCH Score                   Interventions Interventions: Breast feeding basics reviewed  Lactation Tools Discussed/Used Tools: Shells;Pump Shell Type: Inverted Breast pump type: Manual Pump Review: Milk Storage   Consult Status Consult Status: Complete Date: 03/09/17    Jacqueline Skinner 03/09/2017, 9:46 AM

## 2017-03-14 ENCOUNTER — Inpatient Hospital Stay (HOSPITAL_COMMUNITY)
Admission: AD | Admit: 2017-03-14 | Discharge: 2017-03-15 | Disposition: A | Discharge status: Home / Self Care | Payer: BLUE CROSS/BLUE SHIELD | Source: Ambulatory Visit | Attending: Obstetrics and Gynecology | Admitting: Obstetrics and Gynecology

## 2017-03-14 DIAGNOSIS — Z79899 Other long term (current) drug therapy: Secondary | ICD-10-CM | POA: Insufficient documentation

## 2017-03-14 DIAGNOSIS — Z791 Long term (current) use of non-steroidal anti-inflammatories (NSAID): Secondary | ICD-10-CM | POA: Insufficient documentation

## 2017-03-14 DIAGNOSIS — O9089 Other complications of the puerperium, not elsewhere classified: Secondary | ICD-10-CM | POA: Insufficient documentation

## 2017-03-14 DIAGNOSIS — Z87891 Personal history of nicotine dependence: Secondary | ICD-10-CM | POA: Insufficient documentation

## 2017-03-14 DIAGNOSIS — M542 Cervicalgia: Secondary | ICD-10-CM | POA: Insufficient documentation

## 2017-03-14 DIAGNOSIS — M5481 Occipital neuralgia: Secondary | ICD-10-CM | POA: Insufficient documentation

## 2017-03-14 DIAGNOSIS — O165 Unspecified maternal hypertension, complicating the puerperium: Secondary | ICD-10-CM | POA: Insufficient documentation

## 2017-03-14 NOTE — MAU Note (Signed)
Pt presents to MAU c/o neck pain that started 2 days ago it had originated in her upper back and upper chest bones pt states she felt as if she was hit by a baseball bat on both sides. The pain then moved to her neck and it was throbbing all night and she was unable to sleep. The pain has since made her neck feel stiff. Pt is unsure if it is from the epidural but wanted to make sure.

## 2017-03-15 ENCOUNTER — Encounter (HOSPITAL_COMMUNITY): Payer: Self-pay | Admitting: *Deleted

## 2017-03-15 DIAGNOSIS — Z79899 Other long term (current) drug therapy: Secondary | ICD-10-CM | POA: Diagnosis not present

## 2017-03-15 DIAGNOSIS — O165 Unspecified maternal hypertension, complicating the puerperium: Secondary | ICD-10-CM | POA: Diagnosis not present

## 2017-03-15 DIAGNOSIS — M5481 Occipital neuralgia: Secondary | ICD-10-CM | POA: Diagnosis not present

## 2017-03-15 DIAGNOSIS — O9089 Other complications of the puerperium, not elsewhere classified: Secondary | ICD-10-CM | POA: Diagnosis not present

## 2017-03-15 DIAGNOSIS — M542 Cervicalgia: Secondary | ICD-10-CM

## 2017-03-15 DIAGNOSIS — Z87891 Personal history of nicotine dependence: Secondary | ICD-10-CM | POA: Diagnosis not present

## 2017-03-15 DIAGNOSIS — Z791 Long term (current) use of non-steroidal anti-inflammatories (NSAID): Secondary | ICD-10-CM | POA: Diagnosis not present

## 2017-03-15 LAB — CBC
HEMATOCRIT: 31.8 % — AB (ref 36.0–46.0)
HEMOGLOBIN: 10.6 g/dL — AB (ref 12.0–15.0)
MCH: 29 pg (ref 26.0–34.0)
MCHC: 33.3 g/dL (ref 30.0–36.0)
MCV: 86.9 fL (ref 78.0–100.0)
Platelets: 313 10*3/uL (ref 150–400)
RBC: 3.66 MIL/uL — ABNORMAL LOW (ref 3.87–5.11)
RDW: 14.3 % (ref 11.5–15.5)
WBC: 7 10*3/uL (ref 4.0–10.5)

## 2017-03-15 LAB — COMPREHENSIVE METABOLIC PANEL
ALBUMIN: 2.6 g/dL — AB (ref 3.5–5.0)
ALK PHOS: 105 U/L (ref 38–126)
ALT: 387 U/L — ABNORMAL HIGH (ref 14–54)
ANION GAP: 6 (ref 5–15)
AST: 66 U/L — ABNORMAL HIGH (ref 15–41)
BILIRUBIN TOTAL: 0.4 mg/dL (ref 0.3–1.2)
BUN: 17 mg/dL (ref 6–20)
CALCIUM: 8.1 mg/dL — AB (ref 8.9–10.3)
CO2: 20 mmol/L — ABNORMAL LOW (ref 22–32)
CREATININE: 0.6 mg/dL (ref 0.44–1.00)
Chloride: 109 mmol/L (ref 101–111)
GFR calc Af Amer: >60 mL/min (ref >60)
GFR calc non Af Amer: >60 mL/min (ref >60)
GLUCOSE: 93 mg/dL (ref 65–99)
Potassium: 3.9 mmol/L (ref 3.5–5.1)
Sodium: 135 mmol/L (ref 135–145)
TOTAL PROTEIN: 5.9 g/dL — AB (ref 6.5–8.1)

## 2017-03-15 LAB — PROTEIN / CREATININE RATIO, URINE
Creatinine, Urine: 121 mg/dL
PROTEIN CREATININE RATIO: 0.08 mg/mg{creat} (ref 0.00–0.15)
Total Protein, Urine: 10 mg/dL

## 2017-03-15 MED ORDER — OXYCODONE-ACETAMINOPHEN 5-325 MG PO TABS: 1.0000 | ORAL_TABLET | ORAL | 0 refills | Status: DC | PRN

## 2017-03-15 MED ORDER — OXYCODONE-ACETAMINOPHEN 5-325 MG PO TABS
2.0000 | ORAL_TABLET | Freq: Once | ORAL | Status: AC
  Administered 2017-03-15: 2 via ORAL
  Filled 2017-03-15: qty 2

## 2017-03-15 NOTE — MAU Provider Note (Signed)
Chief Complaint: Neck Pain (pt thinks it is related to epidural)   First Provider Initiated Contact with Patient 03/15/17 0144      SUBJECTIVE HPI: Jacqueline Skinner is a 37 y.o. [id number] on postpartum day 7 following NSVD who presents to maternity admissions reporting pain in the back of her neck x 2 days. The pain started in her upper back and moved up to both sides of the back of her neck, along her spine on each side. It hurts more to move her head from side to side. It also causes more pain to lie down and improves the pain slightly to sit up. She has tried Flexeril and ibuprofen at home but they have not helped. She called anesthesia today because she is concerned that this pain is caused by her epidural. She denies any pain in her head, vision changes, epigastric pain, numbness or tingling. She denies any other postpartum complications. She is breastfeeding and pumping.      HPI  Past Medical History:  Diagnosis Date  . 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 PROM (premature rupture of membranes) 03/07/2017  . SVD (spontaneous vaginal delivery) 03/07/2017   Past Surgical History:  Procedure Laterality Date  . LEEP  2017   abnormal pap x2 with HR HPV, coplo LGSIL, rare cells +HGSIL (Dr Ellyn HackBovard)   Social History   Socioeconomic History  . Marital status: Married    Spouse name: Not on file  . Number of children: Not on file  . Years of education: Not on file  . Highest education level: Not on file  Social Needs  . Financial resource strain: Not on file  . Food insecurity - worry: Not on file  . Food insecurity - inability: Not on file  . Transportation needs - medical: Not on file  . Transportation needs - non-medical: Not on file  Occupational History  . Not on file  Tobacco Use  . Smoking status: Former Games developermoker  . Smokeless tobacco: Never Used  Substance and Sexual Activity  . Alcohol use: Yes    Comment: occ   . Drug use: No  . Sexual activity: Yes  Other Topics Concern  . Not on file  Social History Narrative  . Not on file   No current facility-administered medications on file prior to encounter.    Current Outpatient Medications on File Prior to Encounter  Medication Sig Dispense Refill  . calcium carbonate (TUMS - DOSED IN MG ELEMENTAL CALCIUM) 500 MG chewable tablet Chew 1-2 tablets by mouth 3 (three) times daily as needed for indigestion or heartburn.    . cyclobenzaprine (FLEXERIL) 5 MG tablet Take by mouth 3 (three) times daily as needed for muscle spasms.    . ferrous sulfate 325 (65 FE) MG tablet Take 325 mg by mouth daily with breakfast.    . ibuprofen (ADVIL,MOTRIN) 600 MG tablet Take 1 tablet (600 mg total) by mouth every 6 (six) hours as needed. 45 tablet 1  . Prenatal Vit-Fe Fumarate-FA (PRENATAL VITAMIN) 27-0.8 MG TABS Take 1 capsule by mouth daily. 100 tablet 3   No Known Allergies  ROS:  Review of Systems  Constitutional: Negative for chills, fatigue and fever.  Eyes: Negative for visual disturbance.  Respiratory: Negative for shortness of breath.   Cardiovascular: Negative for chest pain.  Gastrointestinal: Negative for abdominal pain, nausea and vomiting.  Genitourinary: Negative for difficulty urinating, dysuria, flank pain, pelvic pain,  vaginal bleeding, vaginal discharge and vaginal pain.  Musculoskeletal: Positive for neck pain.  Neurological: Negative for dizziness and headaches.  Psychiatric/Behavioral: Negative.      I have reviewed patient's Past Medical Hx, Surgical Hx, Family Hx, Social Hx, medications and allergies.   Physical Exam   Patient Vitals for the past 24 hrs:  BP Temp Temp src Pulse Resp Height Weight  03/15/17 0427 - 98.3 F (36.8 C) Oral - - - -  03/15/17 0416 (!) 145/86 - - 67 - - -  03/15/17 0401 (!) 144/86 - - 61 - - -  03/15/17 0345 (!) 144/83 - - 61 - - -  03/15/17 0330 (!) 148/87 - - (!) 59 - - -  03/15/17 0316 (!) 141/83 - -  62 - - -  03/15/17 0301 (!) 147/89 - - 62 - - -  03/15/17 0246 (!) 151/91 - - 64 - - -  03/15/17 0231 (!) 152/86 - - (!) 58 - - -  03/15/17 0227 (!) 154/81 - - (!) 55 - - -  03/15/17 0217 (!) 139/95 97.9 F (36.6 C) Axillary (!) 57 17 - -  03/14/17 2354 - - - - - 5\' 1"  (1.549 m) 155 lb 0.6 oz (70.3 kg)   Constitutional: Well-developed, well-nourished female in no acute distress.  HEART: normal rate, heart sounds, regular rhythm RESP: normal effort, lung sounds clear and equal bilaterally GI: Abd soft, non-tender. Pos BS x 4 MS: Extremities nontender, no edema, normal ROM, DTRs wnl Neurologic: Alert and oriented x 4.  GU: Neg CVAT.   LAB RESULTS Results for orders placed or performed during the hospital encounter of 03/14/17 (from the past 24 hour(s))  CBC     Status: Abnormal   Collection Time: 03/15/17  2:54 AM  Result Value Ref Range   WBC 7.0 4.0 - 10.5 K/uL   RBC 3.66 (L) 3.87 - 5.11 MIL/uL   Hemoglobin 10.6 (L) 12.0 - 15.0 g/dL   HCT 95.6 (L) 21.3 - 08.6 %   MCV 86.9 78.0 - 100.0 fL   MCH 29.0 26.0 - 34.0 pg   MCHC 33.3 30.0 - 36.0 g/dL   RDW 57.8 46.9 - 62.9 %   Platelets 313 150 - 400 K/uL  Comprehensive metabolic panel     Status: Abnormal   Collection Time: 03/15/17  2:54 AM  Result Value Ref Range   Sodium 135 135 - 145 mmol/L   Potassium 3.9 3.5 - 5.1 mmol/L   Chloride 109 101 - 111 mmol/L   CO2 20 (L) 22 - 32 mmol/L   Glucose, Bld 93 65 - 99 mg/dL   BUN 17 6 - 20 mg/dL   Creatinine, Ser 5.28 0.44 - 1.00 mg/dL   Calcium 8.1 (L) 8.9 - 10.3 mg/dL   Total Protein 5.9 (L) 6.5 - 8.1 g/dL   Albumin 2.6 (L) 3.5 - 5.0 g/dL   AST 66 (H) 15 - 41 U/L   ALT 387 (H) 14 - 54 U/L   Alkaline Phosphatase 105 38 - 126 U/L   Total Bilirubin 0.4 0.3 - 1.2 mg/dL   GFR calc non Af Amer >60 >60 mL/min   GFR calc Af Amer >60 >60 mL/min   Anion gap 6 5 - 15  Protein / creatinine ratio, urine     Status: None   Collection Time: 03/15/17  2:55 AM  Result Value Ref Range    Creatinine, Urine 121.00 mg/dL   Total Protein, Urine 10 mg/dL  Protein Creatinine Ratio 0.08 0.00 - 0.15 mg/mg[Cre]    --/--/O POS, O POS Performed at [facility], [address], [place], [place] [address]  (02/15 1218)  IMAGING No results found.  MAU Management/MDM: Pt pain is most c/w musculoskeletal pain.  It is less likely spinal headache as it is located in her neck and improves when sitting up.  The pain is worse with movement of her head.  It is unlikely related to her blood pressure, as it is not a true headache but neck pain only.  Discussed orthopedic/musculoskeletal changes postpartum and when holding/breastfeeding newborn as likely causes for pt pain.  Preeclampsia labs ordered and ALT elevated at 387, AST only slightly elevated at 66.  P/C ratio 0.08.  Pt neck pain significantly improved with Percocet 5/325 x 2 tabs in MAU.  Consult Dr Senaida Ores with assessment and findings.  Offered pt choice of staying overnight for observation or close f/u outpatient with BP check on Monday. Pt selects to go home tonight with BP check on Monday. Preeclampsia s/sx reviewed in detail.  Rx for Percocet 5/325, take 1-2 tabs Q 6 hours PRN x 10 tabs.  Continue ibuprofen 600 mg Q 6 hours. Try ice and or heat, increase PO fluids, and try to rest as much as possible. Pt discharged with strict preeclampsia precautions.  ASSESSMENT 1. Musculoskeletal neck pain   2. Bilateral occipital neuralgia   3. Postpartum hypertension     PLAN Discharge home Allergies as of 03/15/2017   No Known Allergies     Medication List    TAKE these medications   calcium carbonate 500 MG chewable tablet Commonly known as:  TUMS - dosed in mg elemental calcium Chew 1-2 tablets by mouth 3 (three) times daily as needed for indigestion or heartburn.   cyclobenzaprine 5 MG tablet Commonly known as:  FLEXERIL Take by mouth 3 (three) times daily as needed for muscle spasms.   ferrous sulfate 325 (65 FE) MG  tablet Take 325 mg by mouth daily with breakfast.   ibuprofen 600 MG tablet Commonly known as:  ADVIL,MOTRIN Take 1 tablet (600 mg total) by mouth every 6 (six) hours as needed.   oxyCODONE-acetaminophen 5-325 MG tablet Commonly known as:  PERCOCET/ROXICET Take 1 tablet by mouth every 4 (four) hours as needed for severe pain.   Prenatal Vitamin 27-0.8 MG Tabs Take 1 capsule by mouth daily.      Follow-up Information    Huel Cote, MD Follow up.   Specialty:  Obstetrics and Gynecology Why:  On Monday morning for BP check.  Return to MAU as needed for emergencies. Contact information: [address] [place] [address] [phone]           Sharen Counter Certified Nurse-Midwife 03/15/2017  4:31 AM

## 2017-03-17 ENCOUNTER — Other Ambulatory Visit: Payer: Self-pay

## 2017-03-17 ENCOUNTER — Emergency Department
Admission: EM | Admit: 2017-03-17 | Discharge: 2017-03-18 | Disposition: A | Discharge status: Home / Self Care | Payer: BLUE CROSS/BLUE SHIELD | Attending: Emergency Medicine | Admitting: Emergency Medicine

## 2017-03-17 ENCOUNTER — Encounter: Payer: Self-pay | Admitting: Emergency Medicine

## 2017-03-17 DIAGNOSIS — M542 Cervicalgia: Secondary | ICD-10-CM | POA: Diagnosis not present

## 2017-03-17 DIAGNOSIS — Z87891 Personal history of nicotine dependence: Secondary | ICD-10-CM | POA: Insufficient documentation

## 2017-03-17 DIAGNOSIS — Z79899 Other long term (current) drug therapy: Secondary | ICD-10-CM | POA: Diagnosis not present

## 2017-03-17 DIAGNOSIS — O165 Unspecified maternal hypertension, complicating the puerperium: Secondary | ICD-10-CM | POA: Diagnosis not present

## 2017-03-17 NOTE — ED Provider Notes (Signed)
[facility] Emergency Department Provider Note  ____________________________________________   First MD Initiated Contact with Patient 03/17/17 2335     (approximate)  I have reviewed the triage vital signs and the nursing notes.   HISTORY  Chief Complaint Hypertension    HPI Jacqueline Skinner is a 37 y.o. female [phone] spontaneous vaginal delivery of a healthy baby boy exactly 10 days ago at [facility] in [place].  She presents for evaluation of persistently elevated blood pressure since the delivery.  She reports that she has never had problems with her blood pressure in the past and has not been on medication.  Since she delivered she has had significantly elevated blood pressures that seem to be going up.  She has followed up at least 3 times with [facility] and her OB/GYN clinic including 3 days ago, 2 days ago, and earlier today.  Each time they did blood work and provided reassurance and told her to follow-up in clinic and have not yet started her on any medication.  After being evaluated earlier today and having lab work drawn but not receiving any results, she went home but her family encouraged her to check her blood pressure at home.  It was in the 180/100 range and her family encouraged her to come back to the emergency department.  Reports that because the wait time at the [facility] emergency department is so long, she decided to come to the [facility] instead.  She has no complaints right now except for a persistent pain in her neck and back of head that radiates down into her shoulders.  This has been present since she delivered as well.  She is breast-feeding and reports that the pain is better when she sits up and changes into certain positions, worse when she lies down flat, and feels like an aching pain slightly worse on the right side of her neck and down into her shoulders which is slightly reproducible with palpation of  the shoulder muscles.  Review of the medical record from her recent clinic visits indicate that they felt this was musculoskeletal, not related to her epidural, and not likely to be related to preeclampsia or hypertension in general.  Nothing in particular has made her high blood pressure better and it seems to be getting worse and she describes it as severe.  She denies any visual changes, confusion, nausea, vomiting, abdominal pain, chest pain, shortness of breath, and dysuria.   Past Medical History:  Diagnosis Date  . 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 PROM (premature rupture of membranes) 03/07/2017  . SVD (spontaneous vaginal delivery) 03/07/2017    Patient Active Problem List   Diagnosis Date Noted  . PROM (premature rupture of membranes) 03/07/2017  . Normal labor and delivery 03/07/2017  . SVD (spontaneous vaginal delivery) 03/07/2017  . Abnormal Pap smear of cervix 03/18/2015  . Mouth ulcers 10/11/2013  . SACROILIITIS 10/13/2007  . SCIATICA 10/13/2007    Past Surgical History:  Procedure Laterality Date  . LEEP  2017   abnormal pap x2 with HR HPV, coplo LGSIL, rare cells +HGSIL (Dr Ellyn Hack)    Prior to Admission medications   Medication Sig Start Date End Date Taking? Authorizing Provider  calcium carbonate (TUMS - DOSED IN MG ELEMENTAL CALCIUM) 500 MG chewable tablet Chew 1-2 tablets by mouth 3 (three) times daily as needed for indigestion or heartburn.   Yes  [provider]  cyclobenzaprine (FLEXERIL) 5 MG tablet Take by mouth 3 (three) times daily as needed for muscle spasms.   Yes [provider]  ferrous sulfate 325 (65 FE) MG tablet Take 325 mg by mouth daily with breakfast.   Yes [provider]  ibuprofen (ADVIL,MOTRIN) 600 MG tablet Take 1 tablet (600 mg total) by mouth every 6 (six) hours as needed. 03/09/17  Yes Bovard-Stuckert, Jody, MD  oxyCODONE-acetaminophen  (PERCOCET/ROXICET) 5-325 MG tablet Take 1 tablet by mouth every 4 (four) hours as needed for severe pain. 03/15/17  Yes Leftwich-Kirby, Wilmer Floor, CNM  Prenatal Vit-Fe Fumarate-FA (PRENATAL VITAMIN) 27-0.8 MG TABS Take 1 capsule by mouth daily. 03/09/17  Yes Bovard-Stuckert, Jody, MD  NIFEdipine (PROCARDIA-XL/ADALAT-CC/NIFEDICAL-XL) 30 MG 24 hr tablet Take 1 tablet (30 mg total) by mouth daily. 03/19/17 03/19/18  Loleta Rose, MD    Allergies Patient has no known allergies.  No family history on file.  Social History Social History   Tobacco Use  . Smoking status: Former Games developer  . Smokeless tobacco: Never Used  Substance Use Topics  . Alcohol use: Yes    Comment: occ  . Drug use: No    Review of Systems Constitutional: No fever/chills Eyes: No visual changes. ENT: No sore throat. Cardiovascular: Denies chest pain. Respiratory: Denies shortness of breath. Gastrointestinal: No abdominal pain.  No nausea, no vomiting.  No diarrhea.  No constipation. Genitourinary: Negative for dysuria. Musculoskeletal: Negative for neck pain.  Negative for back pain. Integumentary: Negative for rash. Neurological: Persistent occipital headache radiating down her neck and into her shoulders . No focal weakness or numbness.   ____________________________________________   PHYSICAL EXAM:  VITAL SIGNS: ED Triage Vitals  Enc Vitals Group     BP 03/17/17 2320 (!) 172/106     Pulse Rate 03/17/17 2320 77     Resp 03/17/17 2320 18     Temp 03/17/17 2320 99.4 F (37.4 C)     Temp Source 03/17/17 2320 Oral     SpO2 03/17/17 2320 95 %     Weight 03/17/17 2315 70.3 kg (155 lb)     Height 03/17/17 2315 1.549 m (5\' 1" )     Head Circumference --      Peak Flow --      Pain Score 03/17/17 2315 4     Pain Loc --      Pain Edu? --      Excl. in GC? --     Constitutional: Alert and oriented. Well appearing and in no acute distress. Eyes: Conjunctivae are normal. PERRL. EOMI. Head: Atraumatic. Nose:  No congestion/rhinnorhea. Mouth/Throat: Mucous membranes are moist. Neck: No stridor.  No meningeal signs.   Cardiovascular: Normal rate, regular rhythm. Good peripheral circulation. Grossly normal heart sounds. Respiratory: Normal respiratory effort.  No retractions. Lungs CTAB. Gastrointestinal: Soft and nontender. No distention.  Musculoskeletal: No lower extremity tenderness nor edema. No gross deformities of extremities. Reproducible tenderness to palpation of the right side of her neck and right shoulder with no bruit or swelling. Neurologic:  Normal speech and language. No gross focal neurologic deficits are appreciated.  Skin:  Skin is warm, dry and intact. No rash noted. Psychiatric: Mood and affect are normal. Speech and behavior are normal.  ____________________________________________   LABS (all labs ordered are listed, but only abnormal results are displayed)  Labs Reviewed  CBC WITH DIFFERENTIAL/PLATELET - Abnormal; Notable for the following components:      Result Value   Hemoglobin 11.5 (*)  HCT 33.1 (*)    All other components within normal limits  COMPREHENSIVE METABOLIC PANEL - Abnormal; Notable for the following components:   Calcium 8.3 (*)    Total Protein 5.9 (*)    Albumin 2.9 (*)    ALT 151 (*)    All other components within normal limits  LACTATE DEHYDROGENASE - Abnormal; Notable for the following components:   LDH 225 (*)    All other components within normal limits  URINALYSIS, ROUTINE W REFLEX MICROSCOPIC - Abnormal; Notable for the following components:   Color, Urine STRAW (*)    APPearance CLEAR (*)    Hgb urine dipstick LARGE (*)    Bacteria, UA RARE (*)    Squamous Epithelial / LPF 0-5 (*)    All other components within normal limits  LIPASE, BLOOD  MAGNESIUM   ____________________________________________  EKG  None - EKG not ordered by ED physician ____________________________________________  RADIOLOGY   ED MD interpretation:  No imaging indicated  Official radiology report(s): No results found.  ____________________________________________   PROCEDURES  Critical Care performed: No   Procedure(s) performed:   Procedures   ____________________________________________   INITIAL IMPRESSION / ASSESSMENT AND PLAN / ED COURSE  As part of my medical decision making, I reviewed the following data within the electronic MEDICAL RECORD NUMBER Nursing notes reviewed and incorporated, Labs reviewed , Old chart reviewed, A phone consult was requested and obtained from this/these consultant(s) (OB/GYN) and Notes from prior ED visits    Differential diagnosis includes, but is not limited to, postpartum preeclampsia, essential hypertension,  Post epidural spinal headache, idiopathic intracranial hypertension, cavernous sinus thrombosis, subarachnoid hemorrhage, etc.  The pain in her neck could also be related any of these as well as carotid dissection or infectious process, but most likely is musculoskeletal.  It is concerning to see that she is profoundly hypertensive and it seems to be getting worse.  I can see her lab results from her prior [facility] and I will repeat the appropriate lab work today although obviously a 24-hour urine collection is not currently possible.  The patient is very well-appearing with no neurological symptoms other than the headache but no focal neurological deficits and I anticipate touching base with OB/GYN either at this facility or in [place] after getting the results of her labs.  Given that she is significantly hypertensive, I am going to err on the side of caution and administer 4g IV magnesium sulfate while awaiting results and deciding whether or not to initiate antihypertensive treatment.  Clinical Course as of Mar 18 318  Tue Mar 18, 2017  0131 Labs generally reassuring, no indication of HELLP syndrome.  Although ALT is slightly elevated, it is down from 2-3 days ago when  checked [facility].  CBC unremarkable.  LDH slightly elevated, but no value against which to compare.  Patient's most recent blood pressure is 161/94, and she is currently getting the magnesium infusion.  I have paged [facility] to discuss how they would like me to proceed.  [CF]  0247 I had the opportunity to speak by phone with Dr. Sherian ReinJody Bovard-Stuckert, her OB/GYN in [place].  We discussed the case extensively and came to the same conclusion that there is no evidence of HELLP syndrome or postpartum preeclampsia at this time, although the persistently elevated blood pressure is concerning.  We both agree that the neck pain seems more musculoskeletal than anything and the patient certainly has no sign of meningismus or an  infectious process. Dr. Hinton Rao also let me know that anesthesia had been part of the recent evaluation when the patient came back to the hospital and they also felt that this was not related to the epidural procedure.    Discussed the appropriate plan and agreed that I would start her on Procardia XL 30 mg daily and the patient will follow up in clinic within 2 days for blood pressure recheck.  As recommended, I advised the patient that she may develop headaches as a result of the Procardia.  She and her husband understand and agree with the plan.  [CF]    Clinical Course User Index [CF] Loleta Rose, MD    ____________________________________________  FINAL CLINICAL IMPRESSION(S) / ED DIAGNOSES  Final diagnoses:  Postpartum hypertension  Neck pain     MEDICATIONS GIVEN DURING THIS VISIT:  Medications  magnesium sulfate IVPB 4 g 100 mL (0 g Intravenous Stopped 03/18/17 0245)  NIFEdipine (PROCARDIA-XL/ADALAT CC) 24 hr tablet 30 mg (30 mg Oral Given 03/18/17 0234)     ED Discharge Orders        Ordered    NIFEdipine (PROCARDIA-XL/ADALAT-CC/NIFEDICAL-XL) 30 MG 24 hr tablet  Daily     03/18/17 0253       Note:  This document was prepared  using Dragon voice recognition software and may include unintentional dictation errors.    Loleta Rose, MD 03/18/17 [id number]

## 2017-03-17 NOTE — ED Triage Notes (Addendum)
Patient ambulatory to triage with steady gait, without difficulty or distress noted; pt reports vag delivery 1-2wks ago, now with BP 185/101 and c/o neck pain with neck movement; st had epidural

## 2017-03-18 LAB — COMPREHENSIVE METABOLIC PANEL
ALT: 151 U/L — AB (ref 14–54)
AST: 24 U/L (ref 15–41)
Albumin: 2.9 g/dL — ABNORMAL LOW (ref 3.5–5.0)
Alkaline Phosphatase: 89 U/L (ref 38–126)
Anion gap: 9 (ref 5–15)
BUN: 12 mg/dL (ref 6–20)
CHLORIDE: 108 mmol/L (ref 101–111)
CO2: 23 mmol/L (ref 22–32)
CREATININE: 0.62 mg/dL (ref 0.44–1.00)
Calcium: 8.3 mg/dL — ABNORMAL LOW (ref 8.9–10.3)
Glucose, Bld: 93 mg/dL (ref 65–99)
Potassium: 3.7 mmol/L (ref 3.5–5.1)
SODIUM: 140 mmol/L (ref 135–145)
Total Bilirubin: 0.4 mg/dL (ref 0.3–1.2)
Total Protein: 5.9 g/dL — ABNORMAL LOW (ref 6.5–8.1)

## 2017-03-18 LAB — URINALYSIS, ROUTINE W REFLEX MICROSCOPIC
BILIRUBIN URINE: NEGATIVE
GLUCOSE, UA: NEGATIVE mg/dL
KETONES UR: NEGATIVE mg/dL
LEUKOCYTES UA: NEGATIVE
NITRITE: NEGATIVE
PH: 7 (ref 5.0–8.0)
Protein, ur: NEGATIVE mg/dL
SPECIFIC GRAVITY, URINE: 1.005 (ref 1.005–1.030)

## 2017-03-18 LAB — CBC WITH DIFFERENTIAL/PLATELET
BASOS ABS: 0 10*3/uL (ref 0–0.1)
Basophils Relative: 0 %
EOS ABS: 0.2 10*3/uL (ref 0–0.7)
EOS PCT: 2 %
HCT: 33.1 % — ABNORMAL LOW (ref 35.0–47.0)
Hemoglobin: 11.5 g/dL — ABNORMAL LOW (ref 12.0–16.0)
Lymphocytes Relative: 16 %
Lymphs Abs: 1.2 10*3/uL (ref 1.0–3.6)
MCH: 29.7 pg (ref 26.0–34.0)
MCHC: 34.9 g/dL (ref 32.0–36.0)
MCV: 85 fL (ref 80.0–100.0)
MONO ABS: 0.7 10*3/uL (ref 0.2–0.9)
Monocytes Relative: 10 %
Neutro Abs: 5.3 10*3/uL (ref 1.4–6.5)
Neutrophils Relative %: 72 %
PLATELETS: 357 10*3/uL (ref 150–440)
RBC: 3.89 MIL/uL (ref 3.80–5.20)
RDW: 14.5 % (ref 11.5–14.5)
WBC: 7.4 10*3/uL (ref 3.6–11.0)

## 2017-03-18 LAB — LIPASE, BLOOD: LIPASE: 22 U/L (ref 11–51)

## 2017-03-18 LAB — MAGNESIUM: Magnesium: 1.9 mg/dL (ref 1.7–2.4)

## 2017-03-18 LAB — LACTATE DEHYDROGENASE: LDH: 225 U/L — AB (ref 98–192)

## 2017-03-18 MED ORDER — MAGNESIUM SULFATE 4 GM/100ML IV SOLN
4.0000 g | Freq: Once | INTRAVENOUS | Status: AC
  Administered 2017-03-18: 4 g via INTRAVENOUS
  Filled 2017-03-18: qty 100

## 2017-03-18 MED ORDER — NIFEDIPINE ER 30 MG PO TB24
30.0000 mg | ORAL_TABLET | Freq: Once | ORAL | Status: AC
  Administered 2017-03-18: 30 mg via ORAL
  Filled 2017-03-18: qty 1

## 2017-03-18 MED ORDER — NIFEDIPINE ER OSMOTIC RELEASE 30 MG PO TB24: 30.0000 mg | ORAL_TABLET | Freq: Every day | ORAL | 1 refills | Status: DC

## 2017-03-18 NOTE — Discharge Instructions (Signed)
As we discussed, your workup was reassuring today.  We discussed the case with your OB/GYN and decided to start you on a medication for your blood pressure called Procardia-XL.  Be aware that it could actually cause headaches, particularly once your acute blood pressure issues have resolved, and we do encourage you to follow-up with your doctor within the next 2 days for blood pressure recheck.  We recommend that you follow-up at the [facility] or the nearest emergency department if you develop any new or worsening symptoms that concern you.

## 2017-03-27 ENCOUNTER — Ambulatory Visit: Payer: BLUE CROSS/BLUE SHIELD

## 2017-04-22 ENCOUNTER — Telehealth: Payer: Self-pay | Admitting: Primary Care

## 2017-04-22 ENCOUNTER — Encounter: Payer: Self-pay | Admitting: Primary Care

## 2017-04-22 ENCOUNTER — Encounter (INDEPENDENT_AMBULATORY_CARE_PROVIDER_SITE_OTHER): Payer: Self-pay

## 2017-04-22 ENCOUNTER — Ambulatory Visit: Payer: BLUE CROSS/BLUE SHIELD | Admitting: Primary Care

## 2017-04-22 VITALS — BP 120/78 | HR 72 | Temp 98.2°F | Ht 61.0 in | Wt 145.0 lb

## 2017-04-22 DIAGNOSIS — R945 Abnormal results of liver function studies: Secondary | ICD-10-CM | POA: Diagnosis not present

## 2017-04-22 DIAGNOSIS — K649 Unspecified hemorrhoids: Secondary | ICD-10-CM

## 2017-04-22 DIAGNOSIS — O1003 Pre-existing essential hypertension complicating the puerperium: Secondary | ICD-10-CM | POA: Insufficient documentation

## 2017-04-22 DIAGNOSIS — R7989 Other specified abnormal findings of blood chemistry: Secondary | ICD-10-CM

## 2017-04-22 LAB — HEPATIC FUNCTION PANEL
ALT: 39 U/L — ABNORMAL HIGH (ref 0–35)
AST: 26 U/L (ref 0–37)
Albumin: 4.2 g/dL (ref 3.5–5.2)
Alkaline Phosphatase: 75 U/L (ref 39–117)
BILIRUBIN DIRECT: 0.1 mg/dL (ref 0.0–0.3)
BILIRUBIN TOTAL: 0.5 mg/dL (ref 0.2–1.2)
Total Protein: 7 g/dL (ref 6.0–8.3)

## 2017-04-22 MED ORDER — HYDROCORTISONE 2.5 % RE CREA: 1.0000 "application " | TOPICAL_CREAM | Freq: Two times a day (BID) | RECTAL | 0 refills | Status: DC

## 2017-04-22 MED ORDER — HYDROCORTISONE ACETATE 25 MG RE SUPP: 25.0000 mg | Freq: Two times a day (BID) | RECTAL | 0 refills | Status: DC

## 2017-04-22 NOTE — Telephone Encounter (Signed)
Noted, Rx for anusol cream sent to pharmacy. Have patient apply twice daily for one week.  Will you have her update me if no improvement or if medication is still too costly?

## 2017-04-22 NOTE — Progress Notes (Signed)
Subjective:    Patient ID: Jacqueline Skinner, female    DOB: [birth year], 37 y.o.   MRN: [id number]  HPI  Jacqueline Skinner is a 37 year old female who presents today to establish care and discuss the problems mentioned below. Will obtain old records.  1) Essential Hypertension: History of hypertension that was diagnosed in March 2019 after post-partum in mid February 2019. Blood pressure readings started getting around 180/100 on average. She is currently managed on nifedipine XL 30 mg once daily that was initiated by the emergency department. She is breast feeding. She denies history of preeclampsia.   She's checking her BP at home and is getting readings of 120/70's. She has a family history of hypertension in mother. She never had elevated blood pressure readings with her first child 10 years ago.   BP Readings from Last 3 Encounters:  04/22/17 120/78  03/18/17 (!) 159/95  03/15/17 (!) 145/86     2) Elevated Liver Enzymes: First noticed in late February 2019 with ALT of 387 and AST of 66. Repeat liver enzymes several days later with AST of 151, AST of 24. She denies excessive use of aspirin, Tylenol.   3) Rectal Pain: Present since mid February after delivery. She notices a mild amount of right red rectal bleeding on the toilet paper any time she has a bowel movement. Her sister has internal hemorrhoids. She's not tried anything OTC for her symptoms. She denies constipation and diarrhea.   Review of Systems  Respiratory: Negative for shortness of breath.   Cardiovascular: Negative for chest pain.  Gastrointestinal: Negative for abdominal pain.       Rectal bleeding and pain  Neurological: Negative for dizziness and headaches.       Past Medical History:  Diagnosis Date  . 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 PROM (premature rupture of membranes) 03/07/2017  . SVD (spontaneous vaginal delivery) 03/07/2017     Social  History   Socioeconomic History  . Marital status: Married    Spouse name: Not on file  . Number of children: Not on file  . Years of education: Not on file  . Highest education level: Not on file  Occupational History  . Not on file  Social Needs  . Financial resource strain: Not on file  . Food insecurity:    Worry: Not on file    Inability: Not on file  . Transportation needs:    Medical: Not on file    Non-medical: Not on file  Tobacco Use  . Smoking status: Former Games developer  . Smokeless tobacco: Never Used  Substance and Sexual Activity  . Alcohol use: Yes    Comment: occ  . Drug use: No  . Sexual activity: Yes  Lifestyle  . Physical activity:    Days per week: Not on file    Minutes per session: Not on file  . Stress: Not on file  Relationships  . Social connections:    Talks on phone: Not on file    Gets together: Not on file    Attends religious service: Not on file    Active member of club or organization: Not on file    Attends meetings of clubs or organizations: Not on file    Relationship status: Not on file  . Intimate partner violence:    Fear of current or ex partner: Not on file    Emotionally abused: Not  on file    Physically abused: Not on file    Forced sexual activity: Not on file  Other Topics Concern  . Not on file  Social History Narrative  . Not on file    Past Surgical History:  Procedure Laterality Date  . LEEP  2017   abnormal pap x2 with HR HPV, coplo LGSIL, rare cells +HGSIL (Dr Ellyn HackBovard)    No family history on file.  No Known Allergies  Current Outpatient Medications on File Prior to Visit  Medication Sig Dispense Refill  . NIFEdipine (PROCARDIA-XL/ADALAT-CC/NIFEDICAL-XL) 30 MG 24 hr tablet Take 1 tablet (30 mg total) by mouth daily. 30 tablet 1  . norethindrone (CAMILA) 0.35 MG tablet Camila 0.35 mg tablet  Take 1 tablet every day by oral route.    . Prenatal Vit-Fe Fumarate-FA (PRENATAL VITAMIN) 27-0.8 MG TABS Take 1 capsule  by mouth daily. 100 tablet 3   No current facility-administered medications on file prior to visit.     BP 120/78   Pulse 72   Temp 98.2 F (36.8 C) (Oral)   Ht 5\' 1"  (1.549 m)   Wt 145 lb (65.8 kg)   SpO2 98%   Breastfeeding? Yes   BMI 27.40 kg/m    Objective:   Physical Exam  Constitutional: She appears well-nourished.  Neck: Neck supple.  Cardiovascular: Normal rate and regular rhythm.  Pulmonary/Chest: Effort normal and breath sounds normal.  Genitourinary: Rectal exam shows internal hemorrhoid. Rectal exam shows no external hemorrhoid.  Genitourinary Comments: Small non thrombosed hemorrhoid appreciated to the interior rectum at 9 o'clock position  Skin: Skin is warm and dry.  Psychiatric: She has a normal mood and affect.          Assessment & Plan:  Hemorrhoid:  Rectal pain since delivery in mid February 2019. Exam today with small internal hemorrhoid. [facility]. She will update if no improvement.  Jacqueline NestKatherine K Judine Arciniega, NP

## 2017-04-22 NOTE — Telephone Encounter (Signed)
Copied from CRM [phone]. Topic: Quick Communication - See Telephone Encounter >> Apr 22, 2017  1:00 PM Landry MellowFoltz, Melissa J wrote: CRM for notification. See Telephone encounter for: 04/22/17. [facility] called -  hydrocortisone (ANUSOL-HC) 25 MG suppository is not covered by insurance - it is 154.00  is there an alternate for this? [facility] [place] [phone]

## 2017-04-22 NOTE — Patient Instructions (Signed)
Insert the hydrocortisone suppository twice daily for one week for hemorrhoid.   Stop by the lab prior to leaving today. I will notify you of your results once received.   Please schedule a follow up appointment in 3 months for blood pressure check.   It was a pleasure to meet you today! Please don't hesitate to call or message me with any questions. Welcome to [facility]!

## 2017-04-22 NOTE — Assessment & Plan Note (Signed)
Diagnosed in March 2019, currently managed on nifedipine XR 30 mg. Will continue this medication for now, recheck BP in 3 months. Consider weaning off of medication at that time. She will continue to monitor BP levels.

## 2017-04-23 NOTE — Telephone Encounter (Signed)
Unable to contact patient and cannot leave message since voicemail box is full

## 2017-04-30 NOTE — Telephone Encounter (Signed)
Per DPR, left detail message of Graylon GunningKate Clark's comments for patient if needed.

## 2017-05-05 ENCOUNTER — Encounter: Payer: Self-pay | Admitting: *Deleted

## 2017-05-16 ENCOUNTER — Telehealth: Payer: Self-pay | Admitting: Primary Care

## 2017-05-16 DIAGNOSIS — O1003 Pre-existing essential hypertension complicating the puerperium: Secondary | ICD-10-CM

## 2017-05-16 NOTE — Telephone Encounter (Signed)
Copied from CRM [phone]. Topic: Quick Communication - Rx Refill/Question >> May 16, 2017  4:58 PM Jacqueline BergeronBarksdale, Jacqueline B wrote: Medication: NIFEdipine (PROCARDIA-XL/ADALAT-CC/NIFEDICAL-XL) 30 MG 24 hr tablet [[id number]][[id number]]  Has the patient contacted their pharmacy? Yes.   (Agent: If no, request that the patient contact the pharmacy for the refill.) Preferred Pharmacy (with phone number or street name): [facility] Agent: Please be advised that RX refills may take up to 3 business days. We ask that you follow-up with your pharmacy.

## 2017-05-19 NOTE — Telephone Encounter (Signed)
Refill request for Nifedipine 30 mg 24 hr tablet  Last refill was  03/19/17 for #30 tabs  Historical provider for this med.  LOV  04/22/17 Provider  Vernona Rieger, NP  Please review.

## 2017-05-19 NOTE — Telephone Encounter (Signed)
Did she run out of this medication? I see where she received a month's supply in late February.  If she ran out, was she monitoring her BP? What was it off of the medication?

## 2017-05-20 NOTE — Telephone Encounter (Signed)
Tried to call patient but voice mail box is full. 

## 2017-05-26 ENCOUNTER — Encounter: Payer: Self-pay | Admitting: Primary Care

## 2017-05-26 NOTE — Telephone Encounter (Signed)
Pt returned phone call, call pt back when possible

## 2017-05-26 NOTE — Telephone Encounter (Signed)
Per DPR, left detail message of Kate Clark's comments for patient to call back 

## 2017-05-27 ENCOUNTER — Encounter (INDEPENDENT_AMBULATORY_CARE_PROVIDER_SITE_OTHER): Payer: Self-pay

## 2017-05-27 ENCOUNTER — Other Ambulatory Visit: Payer: Self-pay | Admitting: Primary Care

## 2017-05-27 DIAGNOSIS — O1003 Pre-existing essential hypertension complicating the puerperium: Secondary | ICD-10-CM

## 2017-05-27 MED ORDER — NIFEDIPINE ER OSMOTIC RELEASE 30 MG PO TB24: 30.0000 mg | ORAL_TABLET | Freq: Every day | ORAL | 0 refills | Status: DC

## 2017-05-27 NOTE — Telephone Encounter (Deleted)
Pl

## 2017-05-27 NOTE — Telephone Encounter (Signed)
Message from Mychart, Generic sent at 05/26/2017 11:12 PM EDT -----  Genevieve Norlander,  Sorry, I have missed [place] phone calls. I wanted to let you know that my blood pressure has been running about 120/78 with the blood pressure meds. I haven't stopped taking them. I ran out a week ago and since it was late Friday I could not get in touch with you. I called my Obgyn and she sent me in a prescription for 30 days so I still have at least 20 pills before I would need a refill.  She told me to check with you on whether I should continue to take.    Thanks, Hilda Blades

## 2017-05-27 NOTE — Telephone Encounter (Signed)
I'll respond via My Chart. Thanks.

## 2017-05-27 NOTE — Addendum Note (Signed)
Addended by: Doreene Nest on: 05/27/2017 08:32 AM   Modules accepted: Orders

## 2017-09-09 ENCOUNTER — Other Ambulatory Visit: Payer: Self-pay | Admitting: Primary Care

## 2017-09-09 DIAGNOSIS — O1003 Pre-existing essential hypertension complicating the puerperium: Secondary | ICD-10-CM

## 2018-03-05 ENCOUNTER — Other Ambulatory Visit: Payer: Self-pay | Admitting: Primary Care

## 2018-03-05 DIAGNOSIS — O1003 Pre-existing essential hypertension complicating the puerperium: Secondary | ICD-10-CM

## 2018-03-25 ENCOUNTER — Ambulatory Visit: Payer: BLUE CROSS/BLUE SHIELD | Admitting: Family Medicine

## 2018-03-25 ENCOUNTER — Encounter: Payer: Self-pay | Admitting: Family Medicine

## 2018-03-25 VITALS — BP 130/82 | HR 101 | Temp 99.0°F | Ht 61.0 in | Wt 145.3 lb

## 2018-03-25 DIAGNOSIS — J029 Acute pharyngitis, unspecified: Secondary | ICD-10-CM | POA: Diagnosis not present

## 2018-03-25 DIAGNOSIS — J019 Acute sinusitis, unspecified: Secondary | ICD-10-CM | POA: Diagnosis not present

## 2018-03-25 LAB — POCT RAPID STREP A (OFFICE): Rapid Strep A Screen: NEGATIVE

## 2018-03-25 MED ORDER — AMOXICILLIN-POT CLAVULANATE 875-125 MG PO TABS: 1.0000 | ORAL_TABLET | Freq: Two times a day (BID) | ORAL | 0 refills | Status: AC

## 2018-03-25 MED ORDER — AMOXICILLIN-POT CLAVULANATE 875-125 MG PO TABS: 1.0000 | ORAL_TABLET | Freq: Two times a day (BID) | ORAL | 0 refills | Status: DC

## 2018-03-25 NOTE — Progress Notes (Signed)
BP 130/82 (BP Location: Left Arm, Patient Position: Sitting, Cuff Size: Normal)   Pulse (!) 101   Temp 99 F (37.2 C) (Oral)   Ht 5\' 1"  (1.549 m)   Wt 145 lb 5 oz (65.9 kg)   LMP  (Within Weeks)   SpO2 97%   BMI 27.46 kg/m    CC: ST Subjective:    Patient ID: Nehemiah Settle, female    DOB: March 27, 1980, 38 y.o.   MRN: [id number]  HPI: Clea Caine is a 38 y.o. female presenting on 03/25/2018 for Cough (C/o cough, nasal congestion, drainage and sore throat. States cough is worse at night. Also, states she has seen white spots in back of throat that come and go. Sxs started about 1 wk ago. Tried Mucinex, helpful. )   2 wk h/o sinus congestion that progressed to productive cough (clear mucous) over the last week. Yesterday noted ST with white spots in throat. PNdrainage. Mild L earache. HA. Head > chest congestion - this is improving. No recent abx.   No fevers/chills, tooth pain, dyspnea or wheezing.   Taking mucinex DM, sinus congestion medicine and allergy medicine (claritin and allegra).  Son has been sick ?flu. Nephews with the flu as well.  No h/o asthma.  No smokers at home.  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 Interim medical history since our last visit reviewed. Allergies and medications reviewed and updated. Outpatient Medications Prior to Visit  Medication Sig Dispense Refill  . norethindrone (CAMILA) 0.35 MG tablet Camila 0.35 mg tablet  Take 1 tablet every day by oral route.    Marland Kitchen NIFEdipine (PROCARDIA-XL/NIFEDICAL-XL) 30 MG 24 hr tablet Take 1 tablet (30 mg total) by mouth daily. DUE FOR APPOINTMENT FOR ANY MORE REFILLS 90 tablet 0  . Prenatal Vit-Fe Fumarate-FA (PRENATAL VITAMIN) 27-0.8 MG TABS Take 1 capsule by mouth daily. 100 tablet 3  . hydrocortisone (ANUSOL-HC) 2.5 % rectal cream Place 1 application rectally 2 (two) times daily. 30  g 0   No facility-administered medications prior to visit.      Per HPI unless specifically indicated in ROS section below Review of Systems Objective:    BP 130/82 (BP Location: Left Arm, Patient Position: Sitting, Cuff Size: Normal)   Pulse (!) 101   Temp 99 F (37.2 C) (Oral)   Ht 5\' 1"  (1.549 m)   Wt 145 lb 5 oz (65.9 kg)   LMP  (Within Weeks)   SpO2 97%   BMI 27.46 kg/m   Wt Readings from Last 3 Encounters:  03/25/18 145 lb 5 oz (65.9 kg)  04/22/17 145 lb (65.8 kg)  03/17/17 155 lb (70.3 kg)    Physical Exam Vitals signs and nursing note reviewed.  Constitutional:      General: She is not in acute distress.    Appearance: Normal appearance. She is well-developed.  HENT:     Head: Normocephalic and atraumatic.     Right Ear: Hearing, tympanic membrane, ear canal and external ear normal.     Left Ear: Hearing, tympanic membrane, ear canal and external ear normal.     Nose: Congestion present. No mucosal edema or rhinorrhea.     Right Sinus: Maxillary sinus tenderness (mild) present. No frontal sinus tenderness.     Left Sinus: Maxillary sinus tenderness (mild) present. No frontal sinus tenderness.     Mouth/Throat:  Mouth: Mucous membranes are moist. Oral lesions present.     Pharynx: Uvula midline. Posterior oropharyngeal erythema present. No oropharyngeal exudate.     Tonsils: No tonsillar abscesses.     Comments: Raw posterior roof of mouth and oropharynx Eyes:     General: No scleral icterus.    Conjunctiva/sclera: Conjunctivae normal.     Pupils: Pupils are equal, round, and reactive to light.  Neck:     Musculoskeletal: Normal range of motion and neck supple.  Cardiovascular:     Rate and Rhythm: Normal rate and regular rhythm.     Pulses: Normal pulses.     Heart sounds: Normal heart sounds. No murmur.  Pulmonary:     Effort: Pulmonary effort is normal. No respiratory distress.     Breath sounds: Normal breath sounds. No wheezing, rhonchi or rales.    Lymphadenopathy:     Cervical: Cervical adenopathy (R PC LAD) present.  Skin:    General: Skin is warm and dry.     Findings: No rash.  Neurological:     Mental Status: She is alert.        Assessment & Plan:   Problem List Items Addressed This Visit    Acute sinusitis - Primary    Anticipate bacterial given duration and progression of symptoms - treat with augmentin course. Supportive care as per instructions.  No signs of thrush today. Discussed if white film developing on abx, to call us for nystatin course to treat possible thrush.  RST negative       Other Visit Diagnoses    Sore throat       Relevant Orders   POCT rapid strep A (Completed)       No orders of the defined types were placed in this encounter.  Orders Placed This Encounter  Procedures  . POCT rapid strep A    Patient Instructions  You have a sinus infection. Take medicine as prescribed: augmentin for 7 days Push fluids and plenty of rest. Nasal saline irrigation or neti pot to help drain sinuses. May try flonase nasal steroid (over the counter) May use plain mucinex with plenty of fluid to help mobilize mucous. Please let us know if fever >101.5, trouble opening/closing mouth, difficulty swallowing, or worsening instead of improving as expected.   Follow up plan: Return if symptoms worsen or fail to improve.  Eustaquio Boyden, MD

## 2018-03-25 NOTE — Assessment & Plan Note (Addendum)
Anticipate bacterial given duration and progression of symptoms - treat with augmentin course. Supportive care as per instructions.  No signs of thrush today. Discussed if white film developing on abx, to call us for nystatin course to treat possible thrush.  RST negative

## 2018-03-25 NOTE — Addendum Note (Signed)
Addended by: Eustaquio Boyden on: 03/25/2018 01:12 PM   Modules accepted: Orders

## 2018-03-25 NOTE — Patient Instructions (Addendum)
You have a sinus infection. Take medicine as prescribed: augmentin for 7 days Push fluids and plenty of rest. Nasal saline irrigation or neti pot to help drain sinuses. May try flonase nasal steroid (over the counter) May use plain mucinex with plenty of fluid to help mobilize mucous. Please let us know if fever >101.5, trouble opening/closing mouth, difficulty swallowing, or worsening instead of improving as expected.

## 2018-06-04 ENCOUNTER — Other Ambulatory Visit: Payer: Self-pay | Admitting: Primary Care

## 2018-06-04 DIAGNOSIS — O1003 Pre-existing essential hypertension complicating the puerperium: Secondary | ICD-10-CM

## 2018-07-01 ENCOUNTER — Other Ambulatory Visit: Payer: Self-pay | Admitting: Primary Care

## 2018-07-01 DIAGNOSIS — O1003 Pre-existing essential hypertension complicating the puerperium: Secondary | ICD-10-CM

## 2018-07-31 ENCOUNTER — Ambulatory Visit: Payer: BLUE CROSS/BLUE SHIELD | Admitting: Primary Care

## 2018-07-31 ENCOUNTER — Encounter: Payer: Self-pay | Admitting: Primary Care

## 2018-07-31 ENCOUNTER — Ambulatory Visit (INDEPENDENT_AMBULATORY_CARE_PROVIDER_SITE_OTHER): Payer: BC Managed Care – PPO | Admitting: Primary Care

## 2018-07-31 DIAGNOSIS — O1003 Pre-existing essential hypertension complicating the puerperium: Secondary | ICD-10-CM | POA: Diagnosis not present

## 2018-07-31 DIAGNOSIS — R87619 Unspecified abnormal cytological findings in specimens from cervix uteri: Secondary | ICD-10-CM | POA: Diagnosis not present

## 2018-07-31 NOTE — Progress Notes (Signed)
Subjective:    Patient ID: Jacqueline Skinner, female    DOB: [birth year], 38 y.o.   MRN: [id number]  HPI  Virtual Visit via Video Note  I connected with Jacqueline Skinner on 07/31/18 at  9:20 AM EDT by a video enabled telemedicine application and verified that I am speaking with the correct person using two identifiers.  Location: Patient: Home Provider: Office   I discussed the limitations of evaluation and management by telemedicine and the availability of in person appointments. The patient expressed understanding and agreed to proceed.  History of Present Illness:  Ms. Jacqueline Skinner is a 38 year old female who presents today for follow up.  1) Essential Hypertension: Postpartum and diagnosed in March 2019. She is managed on nifedipine Er 30 mg daily.  She's been checking her BP at home over the last several weeks which is running 107/64 to 127/80. She denies dizziness, chest pain, SOB, visual changes.   BP Readings from Last 3 Encounters:  07/31/18 113/70  03/25/18 130/82  04/22/17 120/78     2) Birth Control: Currently managed on Camila 0.35 mg daily. History of abnormal pap smears, laer last pap smear was 2 years ago. She is following with GYN annually with her last visit being in March 2020. She will call her pharmacy for a refill.    Observations/Objective:  Alert and oriented. Appears well, not sickly. No distress. Speaking in complete sentences.   Assessment and Plan:  See problem based charting.  Follow Up Instructions:  Stop nifedipeine for blood pressure for now.  Monitor your blood pressure and report readings that are at or above 135/90.  It was a pleasure to see you today!   I discussed the assessment and treatment plan with the patient. The patient was provided an opportunity to ask questions and all were answered. The patient agreed with the plan and demonstrated an understanding of the instructions.   The patient was advised to call back or seek  an in-person evaluation if the symptoms worsen or if the condition fails to improve as anticipated.     Jacqueline NestKatherine K Tiawanna Luchsinger, NP    Review of Systems  Eyes: Negative for visual disturbance.  Respiratory: Negative for shortness of breath.   Cardiovascular: Negative for chest pain.  Neurological: Negative for dizziness.       Past Medical History:  Diagnosis Date  . 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 Essential hypertension-postpartum   . Hemorrhoid   . PROM (premature rupture of membranes) 03/07/2017  . SVD (spontaneous vaginal delivery) 03/07/2017     Social History   Socioeconomic History  . Marital status: Married    Spouse name: Not on file  . Number of children: Not on file  . Years of education: Not on file  . Highest education level: Not on file  Occupational History  . Not on file  Social Needs  . Financial resource strain: Not on file  . Food insecurity    Worry: Not on file    Inability: Not on file  . Transportation needs    Medical: Not on file    Non-medical: Not on file  Tobacco Use  . Smoking status: Former Games developermoker  . Smokeless tobacco: Never Used  Substance and Sexual Activity  . Alcohol use: Yes    Comment: occ  . Drug use: No  . Sexual activity: Yes  Lifestyle  . Physical activity  Days per week: Not on file    Minutes per session: Not on file  . Stress: Not on file  Relationships  . Social Herbalist on phone: Not on file    Gets together: Not on file    Attends religious service: Not on file    Active member of club or organization: Not on file    Attends meetings of clubs or organizations: Not on file    Relationship status: Not on file  . Intimate partner violence    Fear of current or ex partner: Not on file    Emotionally abused: Not on file    Physically abused: Not on file    Forced sexual activity: Not on file  Other Topics Concern  . Not on file   Social History Narrative   Married.   2 children.   Works in [place].    Past Surgical History:  Procedure Laterality Date  . LEEP  2017   abnormal pap x2 with HR HPV, coplo LGSIL, rare cells +HGSIL (Dr Melba Coon)    No family history on file.  No Known Allergies  Current Outpatient Medications on File Prior to Visit  Medication Sig Dispense Refill  . NIFEdipine (PROCARDIA-XL/NIFEDICAL-XL) 30 MG 24 hr tablet TAKE 1 TABLET (30 MG TOTAL) BY MOUTH DAILY. DUE FOR APPOINTMENT FOR ANY MORE REFILLS 30 tablet 0  . norethindrone (CAMILA) 0.35 MG tablet Camila 0.35 mg tablet  Take 1 tablet every day by oral route.    . Prenatal Vit-Fe Fumarate-FA (PRENATAL VITAMIN) 27-0.8 MG TABS Take 1 capsule by mouth daily. 100 tablet 3   No current facility-administered medications on file prior to visit.     BP 113/70   Wt 145 lb (65.8 kg)   BMI 27.40 kg/m    Objective:   Physical Exam  Constitutional: She is oriented to person, place, and time. She appears well-nourished.  Respiratory: Effort normal.  Neurological: She is alert and oriented to person, place, and time.  Psychiatric: She has a normal mood and affect.           Assessment & Plan:

## 2018-07-31 NOTE — Assessment & Plan Note (Signed)
Following with GYN annually. She will call for OCP refill.

## 2018-07-31 NOTE — Patient Instructions (Signed)
Stop nifedipeine for blood pressure for now.  Monitor your blood pressure and report readings that are at or above 135/90.  It was a pleasure to see you today!

## 2018-07-31 NOTE — Assessment & Plan Note (Signed)
Lower normal readings on nifedipine ER 30 mg. Discussed to stop nifedipine, monitor BP for the next two weeks and send readings via My Chart.   Discussed goal BP range is 120-130/70-80's. She will update.

## 2019-04-17 LAB — HM PAP SMEAR: HM Pap smear: NEGATIVE

## 2019-04-17 LAB — RESULTS CONSOLE HPV: CHL HPV: NEGATIVE

## 2019-04-27 ENCOUNTER — Telehealth: Payer: Self-pay | Admitting: Primary Care

## 2019-04-27 ENCOUNTER — Encounter: Payer: Self-pay | Admitting: Primary Care

## 2019-04-27 ENCOUNTER — Other Ambulatory Visit: Payer: Self-pay

## 2019-04-27 ENCOUNTER — Ambulatory Visit: Payer: BC Managed Care – PPO | Admitting: Primary Care

## 2019-04-27 VITALS — BP 100/68 | HR 99 | Temp 97.4°F | Ht 61.0 in | Wt 145.2 lb

## 2019-04-27 DIAGNOSIS — J302 Other seasonal allergic rhinitis: Secondary | ICD-10-CM

## 2019-04-27 MED ORDER — FLUTICASONE PROPIONATE 50 MCG/ACT NA SUSP: 1.0000 | Freq: Two times a day (BID) | NASAL | 0 refills | Status: DC

## 2019-04-27 NOTE — Progress Notes (Signed)
Subjective:    Patient ID: Jacqueline Skinner, female    DOB: [birth year], 39 y.o.   MRN: [id number]  HPI  This visit occurred during the SARS-CoV-2 public health emergency.  Safety protocols were in place, including screening questions prior to the visit, additional usage of staff PPE, and extensive cleaning of exam room while observing appropriate contact time as indicated for disinfecting solutions.   Ms. Jacqueline Skinner is a 39 year old female with a history of post partum hypertension who presents today with a chief complaint of otalgia.   Her pain is located to bilateral ears, mostly to left side. This began about two days ago. She also reports "head congestion", eye drainage for about three weeks.   She's been using OTC ear drops, and Sudafed with some improvement. She denies fevers, post nasal drip, rhinorrhea. She has noticed feeling "off balance" since her otalgia began.   Review of Systems  Constitutional: Negative for fever.  HENT: Positive for congestion and ear pain.   Eyes:       Eye drainage  Respiratory: Negative for shortness of breath.   Allergic/Immunologic: Positive for environmental allergies.       Past Medical History:  Diagnosis Date  . 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 Essential hypertension-postpartum   . Hemorrhoid   . PROM (premature rupture of membranes) 03/07/2017  . SVD (spontaneous vaginal delivery) 03/07/2017     Social History   Socioeconomic History  . Marital status: Married    Spouse name: Not on file  . Number of children: Not on file  . Years of education: Not on file  . Highest education level: Not on file  Occupational History  . Not on file  Tobacco Use  . Smoking status: Former Games developer  . Smokeless tobacco: Never Used  Substance and Sexual Activity  . Alcohol use: Yes    Comment: occ  . Drug use: No  . Sexual activity: Yes  Other Topics Concern  . Not on file    Social History Narrative   Married.   2 children.   Works in [employer] and [employer].   Social Determinants of Health   Financial Resource Strain:   . Difficulty of Paying Living Expenses:   Food Insecurity:   . Worried About Programme researcher, broadcasting/film/video in the Last Year:   . Barista in the Last Year:   Transportation Needs:   . Freight forwarder (Medical):   Marland Kitchen Lack of Transportation (Non-Medical):   Physical Activity:   . Days of Exercise per Week:   . Minutes of Exercise per Session:   Stress:   . Feeling of Stress :   Social Connections:   . Frequency of Communication with Friends and Family:   . Frequency of Social Gatherings with Friends and Family:   . Attends Religious Services:   . Active Member of Clubs or Organizations:   . Attends Banker Meetings:   Marland Kitchen Marital Status:   Intimate Partner Violence:   . Fear of Current or Ex-Partner:   . Emotionally Abused:   Marland Kitchen Physically Abused:   . Sexually Abused:     Past Surgical History:  Procedure Laterality Date  . LEEP  2017   abnormal pap x2 with HR HPV, coplo LGSIL, rare cells +HGSIL (Dr Ellyn Hack)    No family history on file.  No Known Allergies  Current Outpatient Medications on  File Prior to Visit  Medication Sig Dispense Refill  . NIKKI 3-0.02 MG tablet Take 1 tablet by mouth daily.     No current facility-administered medications on file prior to visit.    BP 100/68   Pulse 99   Temp (!) 97.4 F (36.3 C) (Temporal)   Ht 5\' 1"  (1.549 m)   Wt 145 lb 4 oz (65.9 kg)   LMP 04/27/2019   SpO2 99%   BMI 27.44 kg/m    Objective:   Physical Exam  Constitutional: She appears well-nourished.  HENT:  Right Ear: Ear canal normal. Tympanic membrane is retracted. Tympanic membrane is not erythematous.  Left Ear: Ear canal normal. Tympanic membrane is bulging. Tympanic membrane is not erythematous.  Cardiovascular: Normal rate and regular rhythm.  Respiratory: Effort normal and breath sounds  normal.  Musculoskeletal:     Cervical back: Neck supple.  Skin: Skin is warm and dry.           Assessment & Plan:

## 2019-04-27 NOTE — Telephone Encounter (Signed)
I spoke with pt; pt has lt earache for 3 days; throbbing pain in lt ear on and off but has been more constant this morning. ? Ear drops or possible drainage from lt ear last night.  Pt did vomit x 1 this morning after taking sudafed. Pt did drink a glass of wine last night. No nausea or vomiting now. Pt has had H/A for 2 months in forehead area and sometimes in back of head. Pt scheduled in office appt today at 12 noon per Allayne Gitelman NP. UC precautions given and pt voiced understanding. Pt has no covid symptoms (pt thinks vomit x 1 due to taking sudafed and drinking glass of wine last night;no N & V now; H/A on and off for 2 months like pt usually has this time of yr due to ? Allergies and pollen, no travel and no known exposure to + covid. FYI to Allayne Gitelman NP.

## 2019-04-27 NOTE — Telephone Encounter (Signed)
Pt c/o ear pain x 3 days Sinus congestion, headache.  Denies Runny nose, PND and sore throat  Using ear drops for pain and Sudafed - not much relief.   Pt is concerned of a recurrent ear infection Wants to be seen today - [name] can she be worked in? If so, do you want to do virtual or in office given her mild symptoms?

## 2019-04-27 NOTE — Patient Instructions (Signed)
Nasal Congestion/Ear Pressure/Sinus Pressure: Try using Flonase (fluticasone) nasal spray. Instill 1 spray in each nostril twice daily.   Continue Sudafed for now, do not exceed five consecutive days.  It was a pleasure to see you today!

## 2019-04-27 NOTE — Telephone Encounter (Signed)
Noted, will evaluate patient today. 

## 2019-04-27 NOTE — Telephone Encounter (Signed)
[name] stated she would like patient get triage by Artelia Laroche or Carollee Herter. [name] stated that she would like to see patient in person if possible

## 2019-04-27 NOTE — Assessment & Plan Note (Signed)
HPI and exam today consistent for seasonal/allergy involvement. Rx for Flonase sent to pharmacy. Discussed to avoid use of Sudafed after 5 consecutive days.  She will update.

## 2019-04-29 ENCOUNTER — Ambulatory Visit: Payer: BC Managed Care – PPO | Admitting: Primary Care

## 2019-05-20 ENCOUNTER — Other Ambulatory Visit: Payer: Self-pay | Admitting: Primary Care

## 2019-05-20 DIAGNOSIS — J302 Other seasonal allergic rhinitis: Secondary | ICD-10-CM

## 2020-06-02 ENCOUNTER — Ambulatory Visit: Payer: BC Managed Care – PPO | Admitting: Primary Care

## 2020-06-02 ENCOUNTER — Other Ambulatory Visit: Payer: Self-pay

## 2020-06-02 ENCOUNTER — Encounter: Payer: Self-pay | Admitting: Primary Care

## 2020-06-02 VITALS — BP 110/68 | HR 87 | Temp 98.6°F | Ht 61.0 in | Wt 148.0 lb

## 2020-06-02 DIAGNOSIS — O1003 Pre-existing essential hypertension complicating the puerperium: Secondary | ICD-10-CM

## 2020-06-02 DIAGNOSIS — Z0001 Encounter for general adult medical examination with abnormal findings: Secondary | ICD-10-CM | POA: Insufficient documentation

## 2020-06-02 DIAGNOSIS — K130 Diseases of lips: Secondary | ICD-10-CM

## 2020-06-02 DIAGNOSIS — R519 Headache, unspecified: Secondary | ICD-10-CM

## 2020-06-02 DIAGNOSIS — Z1159 Encounter for screening for other viral diseases: Secondary | ICD-10-CM | POA: Diagnosis not present

## 2020-06-02 DIAGNOSIS — J302 Other seasonal allergic rhinitis: Secondary | ICD-10-CM

## 2020-06-02 DIAGNOSIS — Z0000 Encounter for general adult medical examination without abnormal findings: Secondary | ICD-10-CM | POA: Diagnosis not present

## 2020-06-02 HISTORY — DX: Diseases of lips: K13.0

## 2020-06-02 LAB — CBC
HCT: 37 % (ref 36.0–46.0)
Hemoglobin: 12.5 g/dL (ref 12.0–15.0)
MCHC: 33.7 g/dL (ref 30.0–36.0)
MCV: 85.9 fl (ref 78.0–100.0)
Platelets: 287 10*3/uL (ref 150.0–400.0)
RBC: 4.3 Mil/uL (ref 3.87–5.11)
RDW: 12.9 % (ref 11.5–15.5)
WBC: 5.3 10*3/uL (ref 4.0–10.5)

## 2020-06-02 LAB — COMPREHENSIVE METABOLIC PANEL
ALT: 14 U/L (ref 0–35)
AST: 15 U/L (ref 0–37)
Albumin: 4.3 g/dL (ref 3.5–5.2)
Alkaline Phosphatase: 47 U/L (ref 39–117)
BUN: 12 mg/dL (ref 6–23)
CO2: 26 mEq/L (ref 19–32)
Calcium: 9.8 mg/dL (ref 8.4–10.5)
Chloride: 102 mEq/L (ref 96–112)
Creatinine, Ser: 0.7 mg/dL (ref 0.40–1.20)
GFR: 109.61 mL/min (ref >60.00)
Glucose, Bld: 81 mg/dL (ref 70–99)
Potassium: 4.6 mEq/L (ref 3.5–5.1)
Sodium: 138 mEq/L (ref 135–145)
Total Bilirubin: 0.5 mg/dL (ref 0.2–1.2)
Total Protein: 7.1 g/dL (ref 6.0–8.3)

## 2020-06-02 LAB — LIPID PANEL
Cholesterol: 188 mg/dL (ref 0–200)
HDL: 78.7 mg/dL (ref >39.00)
LDL Cholesterol: 97 mg/dL (ref 0–99)
NonHDL: 109.5
Total CHOL/HDL Ratio: 2
Triglycerides: 63 mg/dL (ref 0.0–149.0)
VLDL: 12.6 mg/dL (ref 0.0–40.0)

## 2020-06-02 LAB — TSH: TSH: 0.82 u[IU]/mL (ref 0.35–4.50)

## 2020-06-02 LAB — VITAMIN B12: Vitamin B-12: 464 pg/mL (ref 211–911)

## 2020-06-02 LAB — VITAMIN D 25 HYDROXY (VIT D DEFICIENCY, FRACTURES): VITD: 20.16 ng/mL — ABNORMAL LOW (ref 30.00–100.00)

## 2020-06-02 MED ORDER — MUPIROCIN 2 % EX OINT: 1.0000 "application " | TOPICAL_OINTMENT | Freq: Two times a day (BID) | CUTANEOUS | 0 refills | Status: DC

## 2020-06-02 MED ORDER — PROPRANOLOL HCL ER 80 MG PO CP24: 80.0000 mg | ORAL_CAPSULE | Freq: Every day | ORAL | 0 refills | Status: DC

## 2020-06-02 NOTE — Assessment & Plan Note (Signed)
Stable, doing well on Allegra daily.

## 2020-06-02 NOTE — Assessment & Plan Note (Signed)
Immunizations UTD. Pap smear UTD, follows with GYN.  Discussed the importance of a healthy diet and regular exercise in order for weight loss, and to reduce the risk of any potential medical problems.  Exam today as noted. Labs pending. 

## 2020-06-02 NOTE — Progress Notes (Signed)
Subjective:    Patient ID: Jacqueline Skinner, female    DOB: 1980/10/15, 40 y.o.   MRN: [id number]  HPI  Jacqueline Skinner is a very pleasant 40 y.o. female with a history of seasonal allergies, hypertension post partum who presents today to discuss cracked lips, headaches, and complete physical.   Chronic headaches for the last 2-3 years which are mostly located to the frontal region. She is taking multiple doses of Ibuprofen daily for headaches with resolve. She denies photophobia, phonophobia, nausea with headaches. She denies a history of migraines.   She's tried chapstick, neosporin. When she eats the cracks will become worse and bleed. She saw your dentist recently who told her it was a vitamin deficiency. Symptoms began 1-2 months, no worse but no better.    Immunizations: -Tetanus: 2018 -Influenza: Completed last season  -Covid-19: Completed 2 vaccines  Diet: She endorses a healthy diet.  Exercise: She is not exercising   Eye exam: Completed this year Dental exam: Completes annually   Pap Smear: UTD follows with GYN.   Review of Systems  Constitutional: Negative for unexpected weight change.  HENT: Negative for rhinorrhea.        Ear fullness   Eyes: Negative for visual disturbance.  Respiratory: Negative for shortness of breath.   Cardiovascular: Negative for chest pain.  Gastrointestinal: Negative for constipation and diarrhea.  Genitourinary: Negative for difficulty urinating and menstrual problem.  Musculoskeletal: Negative for arthralgias.  Skin: Negative for rash.       Cracked lips  Allergic/Immunologic: Negative for environmental allergies.  Neurological: Negative for dizziness, numbness and headaches.  Psychiatric/Behavioral: The patient is not nervous/anxious.          Past Medical History:  Diagnosis Date  . 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 Essential hypertension-postpartum    . Hemorrhoid   . PROM (premature rupture of membranes) 03/07/2017  . SVD (spontaneous vaginal delivery) 03/07/2017    Social History   Socioeconomic History  . Marital status: Married    Spouse name: Not on file  . Number of children: Not on file  . Years of education: Not on file  . Highest education level: Not on file  Occupational History  . Not on file  Tobacco Use  . Smoking status: Former Games developer  . Smokeless tobacco: Never Used  Substance and Sexual Activity  . Alcohol use: Yes    Comment: occ  . Drug use: No  . Sexual activity: Yes  Other Topics Concern  . Not on file  Social History Narrative   Married.   2 children.   Works in [employer] and [place].   Social Determinants of Health   Financial Resource Strain: Not on file  Food Insecurity: Not on file  Transportation Needs: Not on file  Physical Activity: Not on file  Stress: Not on file  Social Connections: Not on file  Intimate Partner Violence: Not on file    Past Surgical History:  Procedure Laterality Date  . LEEP  2017   abnormal pap x2 with HR HPV, coplo LGSIL, rare cells +HGSIL (Dr Ellyn Hack)    History reviewed. No pertinent family history.  No Known Allergies  Current Outpatient Medications on File Prior to Visit  Medication Sig Dispense Refill  . fexofenadine (ALLEGRA) 180 MG tablet Take 180 mg by mouth daily.    Marland Kitchen NIKKI 3-0.02 MG tablet Take 1 tablet by mouth daily.    Marland Kitchen  fluticasone (FLONASE) 50 MCG/ACT nasal spray PLACE 1 SPRAY INTO BOTH NOSTRILS 2 (TWO) TIMES DAILY. (Patient not taking: Reported on 06/02/2020) 16 mL 0   No current facility-administered medications on file prior to visit.    BP 110/68   Pulse 87   Temp 98.6 F (37 C) (Temporal)   Ht 5\' 1"  (1.549 m)   Wt 148 lb (67.1 kg)   SpO2 98%   BMI 27.96 kg/m  Objective:   Physical Exam HENT:     Right Ear: Tympanic membrane and ear canal normal.     Left Ear: Tympanic membrane and ear canal normal.     Nose: Nose normal.   Eyes:     Conjunctiva/sclera: Conjunctivae normal.     Pupils: Pupils are equal, round, and reactive to light.  Neck:     Thyroid: No thyromegaly.  Cardiovascular:     Rate and Rhythm: Normal rate and regular rhythm.     Heart sounds: No murmur heard.   Pulmonary:     Effort: Pulmonary effort is normal.     Breath sounds: Normal breath sounds. No rales.  Abdominal:     General: Bowel sounds are normal.     Palpations: Abdomen is soft.     Tenderness: There is no abdominal tenderness.  Musculoskeletal:        General: Normal range of motion.     Cervical back: Neck supple.  Lymphadenopathy:     Cervical: No cervical adenopathy.  Skin:    General: Skin is warm and dry.     Findings: No rash.  Neurological:     Mental Status: She is alert and oriented to person, place, and time.     Cranial Nerves: No cranial nerve deficit.     Deep Tendon Reflexes: Reflexes are normal and symmetric.  Psychiatric:        Mood and Affect: Mood normal.           Assessment & Plan:      This visit occurred during the SARS-CoV-2 public health emergency.  Safety protocols were in place, including screening questions prior to the visit, additional usage of staff PPE, and extensive cleaning of exam room while observing appropriate contact time as indicated for disinfecting solutions.

## 2020-06-02 NOTE — Assessment & Plan Note (Signed)
Well controlled in the office today, continue off mediations.

## 2020-06-02 NOTE — Assessment & Plan Note (Signed)
Daily and frequent for years, taking multiple doses of Ibuprofen daily.  Neuro exam today normal. Checking CT head without contrast. Rx for propranolol ER 80 mg sent to pharmacy for prevention.  She will update in 2-3 weeks.

## 2020-06-02 NOTE — Assessment & Plan Note (Signed)
Mild case noted on exam today. Will trial mupirocin ointment BID x 1-2 weeks. She will update.  Consider topical antifungal vs topical steroids if no improvement. She will update.

## 2020-06-02 NOTE — Patient Instructions (Signed)
Stop by the lab prior to leaving today. I will notify you of your results once received.   You will be contacted regarding your CT scan.  Please let us know if you have not been contacted within two weeks.   You can start propranolol ER 80 mg once daily for headache prevention.  Apply the mupirocin ointment twice daily for 1-2 weeks. Please update me if no improvement.  It was a pleasure to see you today!   Preventive Care 39-39 Years Old, Female Preventive care refers to lifestyle choices and visits with your health care provider that can promote health and wellness. This includes:  A yearly physical exam. This is also called an annual wellness visit.  Regular dental and eye exams.  Immunizations.  Screening for certain conditions.  Healthy lifestyle choices, such as: ? Eating a healthy diet. ? Getting regular exercise. ? Not using drugs or products that contain nicotine and tobacco. ? Limiting alcohol use. What can I expect for my preventive care visit? Physical exam Your health care provider may check your:  Height and weight. These may be used to calculate your BMI (body mass index). BMI is a measurement that tells if you are at a healthy weight.  Heart rate and blood pressure.  Body temperature.  Skin for abnormal spots. Counseling Your health care provider may ask you questions about your:  Past medical problems.  Family's medical history.  Alcohol, tobacco, and drug use.  Emotional well-being.  Home life and relationship well-being.  Sexual activity.  Diet, exercise, and sleep habits.  Work and work Statistician.  Access to firearms.  Method of birth control.  Menstrual cycle.  Pregnancy history. What immunizations do I need? Vaccines are usually given at various ages, according to a schedule. Your health care provider will recommend vaccines for you based on your age, medical history, and lifestyle or other factors, such as travel or where you  work.   What tests do I need? Blood tests  Lipid and cholesterol levels. These may be checked every 5 years starting at age 40.  Hepatitis C test.  Hepatitis B test. Screening  Diabetes screening. This is done by checking your blood sugar (glucose) after you have not eaten for a while (fasting).  STD (sexually transmitted disease) testing, if you are at risk.  BRCA-related cancer screening. This may be done if you have a family history of breast, ovarian, tubal, or peritoneal cancers.  Pelvic exam and Pap test. This may be done every 3 years starting at age 65. Starting at age 71, this may be done every 5 years if you have a Pap test in combination with an HPV test. Talk with your health care provider about your test results, treatment options, and if necessary, the need for more tests.   Follow these instructions at home: Eating and drinking  Eat a healthy diet that includes fresh fruits and vegetables, whole grains, lean protein, and low-fat dairy products.  Take vitamin and mineral supplements as recommended by your health care provider.  Do not drink alcohol if: ? Your health care provider tells you not to drink. ? You are pregnant, may be pregnant, or are planning to become pregnant.  If you drink alcohol: ? Limit how much you have to 0-1 drink a day. ? Be aware of how much alcohol is in your drink. In the [place] one drink equals one 12 oz bottle of beer (355 mL), one 5 oz glass of wine (148 mL), or  one 1 oz glass of hard liquor (44 mL).   Lifestyle  Take daily care of your teeth and gums. Brush your teeth every morning and night with fluoride toothpaste. Floss one time each day.  Stay active. Exercise for at least 30 minutes 5 or more days each week.  Do not use any products that contain nicotine or tobacco, such as cigarettes, e-cigarettes, and chewing tobacco. If you need help quitting, ask your health care provider.  Do not use drugs.  If you are sexually active,  practice safe sex. Use a condom or other form of protection to prevent STIs (sexually transmitted infections).  If you do not wish to become pregnant, use a form of birth control. If you plan to become pregnant, see your health care provider for a prepregnancy visit.  Find healthy ways to cope with stress, such as: ? Meditation, yoga, or listening to music. ? Journaling. ? Talking to a trusted person. ? Spending time with friends and family. Safety  Always wear your seat belt while driving or riding in a vehicle.  Do not drive: ? If you have been drinking alcohol. Do not ride with someone who has been drinking. ? When you are tired or distracted. ? While texting.  Wear a helmet and other protective equipment during sports activities.  If you have firearms in your house, make sure you follow all gun safety procedures.  Seek help if you have been physically or sexually abused. What's next?  Go to your health care provider once a year for an annual wellness visit.  Ask your health care provider how often you should have your eyes and teeth checked.  Stay up to date on all vaccines. This information is not intended to replace advice given to you by your health care provider. Make sure you discuss any questions you have with your health care provider. Document Revised: 09/05/2019 Document Reviewed: 09/18/2017 Elsevier Patient Education  2021 [employer].

## 2020-06-05 ENCOUNTER — Encounter: Payer: Self-pay | Admitting: Primary Care

## 2020-06-05 LAB — HEPATITIS C ANTIBODY
Hepatitis C Ab: NONREACTIVE
SIGNAL TO CUT-OFF: 0 (ref <1.00)

## 2020-06-12 ENCOUNTER — Other Ambulatory Visit: Payer: Self-pay

## 2020-06-12 ENCOUNTER — Ambulatory Visit
Admission: RE | Admit: 2020-06-12 | Discharge: 2020-06-12 | Disposition: A | Discharge status: Home / Self Care | Payer: BC Managed Care – PPO | Source: Ambulatory Visit | Attending: Primary Care | Admitting: Primary Care

## 2020-06-12 DIAGNOSIS — R519 Headache, unspecified: Secondary | ICD-10-CM | POA: Diagnosis present

## 2020-06-13 DIAGNOSIS — R519 Headache, unspecified: Secondary | ICD-10-CM

## 2020-06-29 ENCOUNTER — Other Ambulatory Visit: Payer: Self-pay | Admitting: Primary Care

## 2020-06-29 DIAGNOSIS — R519 Headache, unspecified: Secondary | ICD-10-CM

## 2020-06-30 ENCOUNTER — Telehealth: Payer: Self-pay

## 2020-06-30 NOTE — Telephone Encounter (Signed)
That's fine since her mother is already a patient of mine.

## 2020-06-30 NOTE — Telephone Encounter (Signed)
Patient states her 40 year old daughter has been seen pediatrician and wanted to get her in with our office for primary. Patient was wondering if [name] would see her. Please let Zowie know either way.Thank you.

## 2020-07-03 NOTE — Telephone Encounter (Signed)
Spoke with Ms Brocato and scheduled her daughter for New Patient appointment. Mailed new patient PPW. Patient will bring her records from pediatrics with her to her office visit.

## 2020-07-11 MED ORDER — PROPRANOLOL HCL ER 80 MG PO CP24: 80.0000 mg | ORAL_CAPSULE | Freq: Every day | ORAL | 1 refills | Status: DC

## 2020-07-21 ENCOUNTER — Encounter: Payer: BC Managed Care – PPO | Admitting: Primary Care

## 2021-01-12 ENCOUNTER — Other Ambulatory Visit: Payer: Self-pay | Admitting: Primary Care

## 2021-01-12 DIAGNOSIS — R519 Headache, unspecified: Secondary | ICD-10-CM

## 2021-03-13 ENCOUNTER — Other Ambulatory Visit: Payer: Self-pay

## 2021-03-13 ENCOUNTER — Ambulatory Visit: Payer: BC Managed Care – PPO | Admitting: Primary Care

## 2021-03-13 ENCOUNTER — Encounter: Payer: Self-pay | Admitting: Primary Care

## 2021-03-13 DIAGNOSIS — K219 Gastro-esophageal reflux disease without esophagitis: Secondary | ICD-10-CM

## 2021-03-13 DIAGNOSIS — H938X2 Other specified disorders of left ear: Secondary | ICD-10-CM

## 2021-03-13 DIAGNOSIS — R253 Fasciculation: Secondary | ICD-10-CM | POA: Insufficient documentation

## 2021-03-13 DIAGNOSIS — J302 Other seasonal allergic rhinitis: Secondary | ICD-10-CM

## 2021-03-13 HISTORY — DX: Other specified disorders of left ear: H93.8X2

## 2021-03-13 HISTORY — DX: Fasciculation: R25.3

## 2021-03-13 MED ORDER — FAMOTIDINE 20 MG PO TABS: 20.0000 mg | ORAL_TABLET | Freq: Every day | ORAL | 0 refills | Status: DC

## 2021-03-13 MED ORDER — FLUTICASONE PROPIONATE 50 MCG/ACT NA SUSP: 1.0000 | Freq: Two times a day (BID) | NASAL | 0 refills | Status: DC

## 2021-03-13 NOTE — Assessment & Plan Note (Signed)
Could be secondary to allergies as there was evidence of fluid behind her left TM.  Discussed use of Flonase twice daily, prescription sent to pharmacy.

## 2021-03-13 NOTE — Progress Notes (Signed)
Subjective:    Patient ID: Jacqueline Skinner, female    DOB: 1980/04/09, 41 y.o.   MRN: [id number]  HPI  Jacqueline Skinner is a very pleasant 41 y.o. female with a history of postpartum hypertension, seasonal allergies, frequent headaches who presents today to discuss multiple symptoms.   1) Twitching: Twitching sensation to the left posterior temporal region proximal to left upper ear. This began 3 weeks ago. She notices this more in the afternoon or at night, not everyday, but is occurring more frequently. Occurs for several seconds and then stops.   She does notice left ear fullness. She denies ear pain, rashes, headaches, symptoms to right side.   She drinks 1 cup of black coffee in the morning, sweet tea in the afternoon and evening.   2) GERD: Chronic for years, overall mild, increased over the last few years. Over the last few months she's been experiencing more frequent symptoms and is taking Tums 2-3 days weekly, 1-2 times daily. Symptoms include esophageal burning, some belching, burning with eating.   She drinks 1 cup of black coffee in the morning, and 1 to 3 glasses of red wine at night.  She drinks caffeine throughout the day.  She denies changes to her diet, laying flat within 2 hours from eating dinner.  She is under some increased stress as she switched jobs, some weight gain as she is more sedentary.    She denies nausea, vomiting, diarrhea.   Wt Readings from Last 3 Encounters:  03/13/21 154 lb (69.9 kg)  06/02/20 148 lb (67.1 kg)  04/27/19 145 lb 4 oz (65.9 kg)      Review of Systems  HENT:  Negative for congestion, ear pain and postnasal drip.        Ear fullness.  See HPI  Gastrointestinal:  Negative for abdominal pain, nausea and vomiting.       GERD  Neurological:  Negative for dizziness and headaches.        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  ([facility])   Essential hypertension-postpartum    Hemorrhoid    PROM (premature rupture of membranes) 03/07/2017   SVD (spontaneous vaginal delivery) 03/07/2017    Social History   Socioeconomic History   Marital status: Married    Spouse name: Not on file   Number of children: Not on file   Years of education: Not on file   Highest education level: Not on file  Occupational History   Not on file  Tobacco Use   Smoking status: Former   Smokeless tobacco: Never  Substance and Sexual Activity   Alcohol use: Yes    Comment: occ   Drug use: No   Sexual activity: Yes  Other Topics Concern   Not on file  Social History Narrative   Married.   2 children.   Works in [employer] and [place].   Social Determinants of Health   Financial Resource Strain: Not on file  Food Insecurity: Not on file  Transportation Needs: Not on file  Physical Activity: Not on file  Stress: Not on file  Social Connections: Not on file  Intimate Partner Violence: Not on file    Past Surgical History:  Procedure Laterality Date   LEEP  2017   abnormal pap x2 with HR HPV, coplo LGSIL, rare cells +HGSIL (Dr Ellyn Hack)    Family History  Problem Relation Age  of Onset   Diabetes Father     No Known Allergies  Current Outpatient Medications on File Prior to Visit  Medication Sig Dispense Refill   fexofenadine (ALLEGRA) 180 MG tablet Take 180 mg by mouth daily.     fluticasone (FLONASE) 50 MCG/ACT nasal spray PLACE 1 SPRAY INTO BOTH NOSTRILS 2 (TWO) TIMES DAILY. 16 mL 0   NIKKI 3-0.02 MG tablet Take 1 tablet by mouth daily.     propranolol ER (INDERAL LA) 80 MG 24 hr capsule TAKE 1 CAPSULE (80 MG TOTAL) BY MOUTH DAILY. FOR HEADACHE PREVENTION 90 capsule 1   No current facility-administered medications on file prior to visit.    BP 110/60    Pulse 83    Temp 98.6 F (37 C) (Oral)    Ht 5\' 1"  (1.549 m)    Wt 154 lb (69.9 kg)    SpO2 97%    BMI 29.10 kg/m  Objective:   Physical Exam Constitutional:       Appearance: She is not ill-appearing.  HENT:     Right Ear: Tympanic membrane and ear canal normal. There is no impacted cerumen.     Left Ear: There is no impacted cerumen.     Ears:     Comments: Small amount of fluid to left TM Skin:    General: Skin is warm and dry.     Findings: No rash.  Neurological:     Mental Status: She is alert.  Psychiatric:        Mood and Affect: Mood normal.          Assessment & Plan:      This visit occurred during the SARS-CoV-2 public health emergency.  Safety protocols were in place, including screening questions prior to the visit, additional usage of staff PPE, and extensive cleaning of exam room while observing appropriate contact time as indicated for disinfecting solutions.

## 2021-03-13 NOTE — Patient Instructions (Signed)
Start famotidine 20 mg once daily for heartburn  Try using Flonase (fluticasone) nasal spray. Instill 1 spray in each nostril twice daily.   Please update me in a few weeks  It was a pleasure to see you today!  Food Choices for Gastroesophageal Reflux Disease, Adult When you have gastroesophageal reflux disease (GERD), the foods you eat and your eating habits are very important. Choosing the right foods can help ease your discomfort. Think about working with a food expert (dietitian) to help you make good choices. What are tips for following this plan? Reading food labels Look for foods that are low in saturated fat. Foods that may help with your symptoms include: Foods that have less than 5% of daily value (DV) of fat. Foods that have 0 grams of trans fat. Cooking Do not fry your food. Cook your food by baking, steaming, grilling, or broiling. These are all methods that do not need a lot of fat for cooking. To add flavor, try to use herbs that are low in spice and acidity. Meal planning  Choose healthy foods that are low in fat, such as: Fruits and vegetables. Whole grains. Low-fat dairy products. Lean meats, fish, and poultry. Eat small meals often instead of eating 3 large meals each day. Eat your meals slowly in a place where you are relaxed. Avoid bending over or lying down until 2-3 hours after eating. Limit high-fat foods such as fatty meats or fried foods. Limit your intake of fatty foods, such as oils, butter, and shortening. Avoid the following as told by your doctor: Foods that cause symptoms. These may be different for different people. Keep a food diary to keep track of foods that cause symptoms. Alcohol. Drinking a lot of liquid with meals. Eating meals during the 2-3 hours before bed. Lifestyle Stay at a healthy weight. Ask your doctor what weight is healthy for you. If you need to lose weight, work with your doctor to do so safely. Exercise for at least 30 minutes  on 5 or more days each week, or as told by your doctor. Wear loose-fitting clothes. Do not smoke or use any products that contain nicotine or tobacco. If you need help quitting, ask your doctor. Sleep with the head of your bed higher than your feet. Use a wedge under the mattress or blocks under the bed frame to raise the head of the bed. Chew sugar-free gum after meals. What foods should eat? Eat a healthy, well-balanced diet of fruits, vegetables, whole grains, low-fat dairy products, lean meats, fish, and poultry. Each person is different. Foods that may cause symptoms in one person may not cause any symptoms in another person. Work with your doctor to find foods that are safe for you. The items listed above may not be a complete list of what you can eat and drink. Contact a food expert for more options. What foods should I avoid? Limiting some of these foods may help in managing the symptoms of GERD. Everyone is different. Talk with a food expert or your doctor to help you find the exact foods to avoid, if any. Fruits Any fruits prepared with added fat. Any fruits that cause symptoms. For some people, this may include citrus fruits, such as oranges, grapefruit, pineapple, and lemons. Vegetables Deep-fried vegetables. [place] fries. Any vegetables prepared with added fat. Any vegetables that cause symptoms. For some people, this may include tomatoes and tomato products, chili peppers, onions and garlic, and horseradish. Grains Pastries or quick breads with  added fat. Meats and other proteins High-fat meats, such as fatty beef or pork, hot dogs, ribs, ham, sausage, salami, and bacon. Fried meat or protein, including fried fish and fried chicken. Nuts and nut butters, in large amounts. Dairy Whole milk and chocolate milk. Sour cream. Cream. Ice cream. Cream cheese. Milkshakes. Fats and oils Butter. Margarine. Shortening. Ghee. Beverages Coffee and tea, with or without caffeine. Carbonated  beverages. Sodas. Energy drinks. Fruit juice made with acidic fruits, such as orange or grapefruit. Tomato juice. Alcoholic drinks. Sweets and desserts Chocolate and cocoa. Donuts. Seasonings and condiments Pepper. Peppermint and spearmint. Added salt. Any condiments, herbs, or seasonings that cause symptoms. For some people, this may include curry, hot sauce, or vinegar-based salad dressings. The items listed above may not be a complete list of what you should not eat and drink. Contact a food expert for more options. Questions to ask your doctor Diet and lifestyle changes are often the first steps that are taken to manage symptoms of GERD. If diet and lifestyle changes do not help, talk with your doctor about taking medicines. Where to find more information [employer] for Gastrointestinal [employer]: aboutgerd.org Summary When you have GERD, food and lifestyle choices are very important in easing your symptoms. Eat small meals often instead of 3 large meals a day. Eat your meals slowly and in a place where you are relaxed. Avoid bending over or lying down until 2-3 hours after eating. Limit high-fat foods such as fatty meats or fried foods. This information is not intended to replace advice given to you by your health care provider. Make sure you discuss any questions you have with your health care provider. Document Revised: 07/19/2019 Document Reviewed: 07/19/2019 Elsevier Patient Education  [place].

## 2021-03-13 NOTE — Assessment & Plan Note (Signed)
She is consuming triggers for GERD including black coffee, red wine.  Handout provided for GERD triggers today.  Trial of famotidine 20 mg once daily sent to pharmacy.  She will update in a few weeks

## 2021-03-13 NOTE — Assessment & Plan Note (Signed)
More so a pulsating sensation.  Exam today benign, no rashes to suggest shingles.  She has no other symptoms except for ear fullness, therefore it is possible that fluid could be built up behind her left TM causing compression to her nerve.  Trial of Flonase twice daily sent to pharmacy. Continue Allegra daily

## 2021-04-11 ENCOUNTER — Other Ambulatory Visit: Payer: Self-pay | Admitting: Primary Care

## 2021-04-11 DIAGNOSIS — J302 Other seasonal allergic rhinitis: Secondary | ICD-10-CM

## 2021-06-08 ENCOUNTER — Ambulatory Visit (INDEPENDENT_AMBULATORY_CARE_PROVIDER_SITE_OTHER): Payer: BC Managed Care – PPO | Admitting: Primary Care

## 2021-06-08 ENCOUNTER — Encounter: Payer: Self-pay | Admitting: Primary Care

## 2021-06-08 VITALS — BP 110/62 | HR 81 | Temp 98.6°F | Ht 61.0 in | Wt 154.0 lb

## 2021-06-08 DIAGNOSIS — J302 Other seasonal allergic rhinitis: Secondary | ICD-10-CM

## 2021-06-08 DIAGNOSIS — R519 Headache, unspecified: Secondary | ICD-10-CM | POA: Diagnosis not present

## 2021-06-08 DIAGNOSIS — Z0000 Encounter for general adult medical examination without abnormal findings: Secondary | ICD-10-CM | POA: Diagnosis not present

## 2021-06-08 DIAGNOSIS — K219 Gastro-esophageal reflux disease without esophagitis: Secondary | ICD-10-CM | POA: Diagnosis not present

## 2021-06-08 DIAGNOSIS — O1003 Pre-existing essential hypertension complicating the puerperium: Secondary | ICD-10-CM

## 2021-06-08 LAB — COMPREHENSIVE METABOLIC PANEL
ALT: 13 U/L (ref 0–35)
AST: 16 U/L (ref 0–37)
Albumin: 4.2 g/dL (ref 3.5–5.2)
Alkaline Phosphatase: 47 U/L (ref 39–117)
BUN: 13 mg/dL (ref 6–23)
CO2: 25 mEq/L (ref 19–32)
Calcium: 9.2 mg/dL (ref 8.4–10.5)
Chloride: 101 mEq/L (ref 96–112)
Creatinine, Ser: 0.79 mg/dL (ref 0.40–1.20)
GFR: 94.13 mL/min (ref >60.00)
Glucose, Bld: 87 mg/dL (ref 70–99)
Potassium: 4.3 mEq/L (ref 3.5–5.1)
Sodium: 135 mEq/L (ref 135–145)
Total Bilirubin: 0.5 mg/dL (ref 0.2–1.2)
Total Protein: 6.9 g/dL (ref 6.0–8.3)

## 2021-06-08 LAB — LIPID PANEL
Cholesterol: 185 mg/dL (ref 0–200)
HDL: 64.5 mg/dL (ref >39.00)
LDL Cholesterol: 106 mg/dL — ABNORMAL HIGH (ref 0–99)
NonHDL: 120.87
Total CHOL/HDL Ratio: 3
Triglycerides: 75 mg/dL (ref 0.0–149.0)
VLDL: 15 mg/dL (ref 0.0–40.0)

## 2021-06-08 NOTE — Assessment & Plan Note (Signed)
Controlled.   Propranolol ER 80 mg daily.

## 2021-06-08 NOTE — Assessment & Plan Note (Signed)
Overall controlled  Continue famotidine 20 mg daily.

## 2021-06-08 NOTE — Assessment & Plan Note (Signed)
Waxes and wanes.  Continue Allegra 180 mg PRN.  Continue Flonase PRN.

## 2021-06-08 NOTE — Progress Notes (Signed)
Subjective:    Patient ID: Jacqueline Skinner, female    DOB: Aug 08, 1980, 41 y.o.   MRN: [id number]  HPI  Jacqueline Skinner is a very pleasant 41 y.o. female who presents today for complete physical and follow up of chronic conditions.  Immunizations: -Tetanus: 2018 -Influenza: Did not complete last season  -Covid-19: 4 vaccines  Diet: Fair diet.  Exercise: No regular exercise.  Eye exam: Completes annually  Dental exam: Completes semi-annually   Pap Smear: Completed in 2021   BP Readings from Last 3 Encounters:  06/08/21 110/62  03/13/21 110/60  06/02/20 110/68   Wt Readings from Last 3 Encounters:  06/08/21 154 lb (69.9 kg)  03/13/21 154 lb (69.9 kg)  06/02/20 148 lb (67.1 kg)      Review of Systems  Constitutional:  Negative for unexpected weight change.  HENT:  Negative for rhinorrhea.   Respiratory:  Negative for cough and shortness of breath.   Cardiovascular:  Negative for chest pain.  Gastrointestinal:  Negative for constipation and diarrhea.  Genitourinary:  Negative for difficulty urinating and menstrual problem.  Musculoskeletal:  Negative for arthralgias and myalgias.  Skin:  Negative for rash.  Allergic/Immunologic: Positive for environmental allergies.  Neurological:  Negative for dizziness and headaches.  Psychiatric/Behavioral:  The patient is not nervous/anxious.         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 ([facility])   Essential hypertension-postpartum    Hemorrhoid    PROM (premature rupture of membranes) 03/07/2017   SVD (spontaneous vaginal delivery) 03/07/2017    Social History   Socioeconomic History   Marital status: Married    Spouse name: Not on file   Number of children: Not on file   Years of education: Not on file   Highest education level: Not on file  Occupational History   Not on file  Tobacco Use   Smoking status: Former    Smokeless tobacco: Never  Substance and Sexual Activity   Alcohol use: Yes    Comment: occ   Drug use: No   Sexual activity: Yes  Other Topics Concern   Not on file  Social History Narrative   Married.   2 children.   Works in [employer] and [employer].   Social Determinants of Health   Financial Resource Strain: Not on file  Food Insecurity: Not on file  Transportation Needs: Not on file  Physical Activity: Not on file  Stress: Not on file  Social Connections: Not on file  Intimate Partner Violence: Not on file    Past Surgical History:  Procedure Laterality Date   LEEP  2017   abnormal pap x2 with HR HPV, coplo LGSIL, rare cells +HGSIL (Dr Ellyn Hack)    Family History  Problem Relation Age of Onset   Diabetes Father     No Known Allergies  Current Outpatient Medications on File Prior to Visit  Medication Sig Dispense Refill   famotidine (PEPCID) 20 MG tablet Take 1 tablet (20 mg total) by mouth daily. For heartburn. 90 tablet 0   fexofenadine (ALLEGRA) 180 MG tablet Take 180 mg by mouth daily.     fluticasone (FLONASE) 50 MCG/ACT nasal spray PLACE 1 SPRAY INTO BOTH NOSTRILS 2 (TWO) TIMES DAILY 16 mL 0   NIKKI 3-0.02 MG tablet Take 1 tablet by mouth daily.     propranolol ER (INDERAL LA) 80 MG 24 hr capsule  TAKE 1 CAPSULE (80 MG TOTAL) BY MOUTH DAILY. FOR HEADACHE PREVENTION 90 capsule 1   No current facility-administered medications on file prior to visit.    BP 110/62   Pulse 81   Temp 98.6 F (37 C) (Oral)   Ht 5\' 1"  (1.549 m)   Wt 154 lb (69.9 kg)   LMP 05/25/2021   SpO2 99%   BMI 29.10 kg/m  Objective:   Physical Exam HENT:     Right Ear: Tympanic membrane and ear canal normal.     Left Ear: Tympanic membrane and ear canal normal.     Nose: Nose normal.  Eyes:     Conjunctiva/sclera: Conjunctivae normal.     Pupils: Pupils are equal, round, and reactive to light.  Neck:     Thyroid: No thyromegaly.  Cardiovascular:     Rate and Rhythm: Normal rate  and regular rhythm.     Heart sounds: No murmur heard. Pulmonary:     Effort: Pulmonary effort is normal.     Breath sounds: Normal breath sounds. No rales.  Abdominal:     General: Bowel sounds are normal.     Palpations: Abdomen is soft.     Tenderness: There is no abdominal tenderness.  Musculoskeletal:        General: Normal range of motion.     Cervical back: Neck supple.  Lymphadenopathy:     Cervical: No cervical adenopathy.  Skin:    General: Skin is warm and dry.     Findings: No rash.  Neurological:     Mental Status: She is alert and oriented to person, place, and time.     Cranial Nerves: No cranial nerve deficit.     Deep Tendon Reflexes: Reflexes are normal and symmetric.  Psychiatric:        Mood and Affect: Mood normal.          Assessment & Plan:      This visit occurred during the SARS-CoV-2 public health emergency.  Safety protocols were in place, including screening questions prior to the visit, additional usage of staff PPE, and extensive cleaning of exam room while observing appropriate contact time as indicated for disinfecting solutions.

## 2021-06-08 NOTE — Patient Instructions (Signed)
Stop by the lab prior to leaving today. I will notify you of your results once received.   Try using Flonase (fluticasone) nasal spray. Instill 1 spray in each nostril twice daily for a few weeks.  It was a pleasure to see you today!  Preventive Care 49-40 Years Old, Female Preventive care refers to lifestyle choices and visits with your health care provider that can promote health and wellness. Preventive care visits are also called wellness exams. What can I expect for my preventive care visit? Counseling During your preventive care visit, your health care provider may ask about your: Medical history, including: Past medical problems. Family medical history. Pregnancy history. Current health, including: Menstrual cycle. Method of birth control. Emotional well-being. Home life and relationship well-being. Sexual activity and sexual health. Lifestyle, including: Alcohol, nicotine or tobacco, and drug use. Access to firearms. Diet, exercise, and sleep habits. Work and work Statistician. Sunscreen use. Safety issues such as seatbelt and bike helmet use. Physical exam Your health care provider may check your: Height and weight. These may be used to calculate your BMI (body mass index). BMI is a measurement that tells if you are at a healthy weight. Waist circumference. This measures the distance around your waistline. This measurement also tells if you are at a healthy weight and may help predict your risk of certain diseases, such as type 2 diabetes and high blood pressure. Heart rate and blood pressure. Body temperature. Skin for abnormal spots. What immunizations do I need?  Vaccines are usually given at various ages, according to a schedule. Your health care provider will recommend vaccines for you based on your age, medical history, and lifestyle or other factors, such as travel or where you work. What tests do I need? Screening Your health care provider may recommend  screening tests for certain conditions. This may include: Pelvic exam and Pap test. Lipid and cholesterol levels. Diabetes screening. This is done by checking your blood sugar (glucose) after you have not eaten for a while (fasting). Hepatitis B test. Hepatitis C test. HIV (human immunodeficiency virus) test. STI (sexually transmitted infection) testing, if you are at risk. BRCA-related cancer screening. This may be done if you have a family history of breast, ovarian, tubal, or peritoneal cancers. Talk with your health care provider about your test results, treatment options, and if necessary, the need for more tests. Follow these instructions at home: Eating and drinking  Eat a healthy diet that includes fresh fruits and vegetables, whole grains, lean protein, and low-fat dairy products. Take vitamin and mineral supplements as recommended by your health care provider. Do not drink alcohol if: Your health care provider tells you not to drink. You are pregnant, may be pregnant, or are planning to become pregnant. If you drink alcohol: Limit how much you have to 0-1 drink a day. Know how much alcohol is in your drink. In the [place] one drink equals one 12 oz bottle of beer (355 mL), one 5 oz glass of wine (148 mL), or one 1 oz glass of hard liquor (44 mL). Lifestyle Brush your teeth every morning and night with fluoride toothpaste. Floss one time each day. Exercise for at least 30 minutes 5 or more days each week. Do not use any products that contain nicotine or tobacco. These products include cigarettes, chewing tobacco, and vaping devices, such as e-cigarettes. If you need help quitting, ask your health care provider. Do not use drugs. If you are sexually active, practice safe sex. Use a  condom or other form of protection to prevent STIs. If you do not wish to become pregnant, use a form of birth control. If you plan to become pregnant, see your health care provider for a prepregnancy  visit. Find healthy ways to manage stress, such as: Meditation, yoga, or listening to music. Journaling. Talking to a trusted person. Spending time with friends and family. Minimize exposure to UV radiation to reduce your risk of skin cancer. Safety Always wear your seat belt while driving or riding in a vehicle. Do not drive: If you have been drinking alcohol. Do not ride with someone who has been drinking. If you have been using any mind-altering substances or drugs. While texting. When you are tired or distracted. Wear a helmet and other protective equipment during sports activities. If you have firearms in your house, make sure you follow all gun safety procedures. Seek help if you have been physically or sexually abused. What's next? Go to your health care provider once a year for an annual wellness visit. Ask your health care provider how often you should have your eyes and teeth checked. Stay up to date on all vaccines. This information is not intended to replace advice given to you by your health care provider. Make sure you discuss any questions you have with your health care provider. Document Revised: 07/05/2020 Document Reviewed: 07/05/2020 Elsevier Patient Education  [place].

## 2021-06-08 NOTE — Assessment & Plan Note (Signed)
Immunizations UTD. Pap smear UTD.  Discussed the importance of a healthy diet and regular exercise in order for weight loss, and to reduce the risk of further co-morbidity.  Exam today stable, labs pending.

## 2021-06-08 NOTE — Assessment & Plan Note (Signed)
Controlled.  Continue to monitor without medication.

## 2021-06-10 ENCOUNTER — Other Ambulatory Visit: Payer: Self-pay | Admitting: Primary Care

## 2021-06-10 DIAGNOSIS — K219 Gastro-esophageal reflux disease without esophagitis: Secondary | ICD-10-CM

## 2021-06-11 ENCOUNTER — Other Ambulatory Visit: Payer: Self-pay | Admitting: Primary Care

## 2021-06-11 DIAGNOSIS — R519 Headache, unspecified: Secondary | ICD-10-CM

## 2021-08-13 ENCOUNTER — Ambulatory Visit: Payer: BC Managed Care – PPO | Admitting: Family Medicine

## 2021-08-13 ENCOUNTER — Encounter: Payer: Self-pay | Admitting: Family Medicine

## 2021-08-13 VITALS — BP 128/80 | HR 94 | Temp 97.3°F | Ht 61.0 in | Wt 154.0 lb

## 2021-08-13 DIAGNOSIS — J22 Unspecified acute lower respiratory infection: Secondary | ICD-10-CM

## 2021-08-13 DIAGNOSIS — R059 Cough, unspecified: Secondary | ICD-10-CM

## 2021-08-13 LAB — POCT RAPID STREP A (OFFICE): Rapid Strep A Screen: NEGATIVE

## 2021-08-13 MED ORDER — AZITHROMYCIN 250 MG PO TABS: ORAL_TABLET | ORAL | 0 refills | Status: AC

## 2021-08-13 MED ORDER — CHERATUSSIN AC 100-10 MG/5ML PO SOLN: 5.0000 mL | Freq: Three times a day (TID) | ORAL | 0 refills | Status: DC | PRN

## 2021-08-13 NOTE — Patient Instructions (Signed)
You have a upper respiratory infection, will send testing for pertussis. Strep test today negative Use medication as prescribed: Take azithromycin antibiotic sent to pharmacy. Take codeine cough syrup sent to pharmacy.  Push fluids and plenty of rest. Please return if you are not improving as expected, or if you have high fevers (>101.5) or difficulty swallowing or worsening productive cough. Call clinic with questions.  Good to see you today. I hope you start feeling better soon.

## 2021-08-13 NOTE — Progress Notes (Signed)
Patient ID: Jacqueline Skinner, female    DOB: 1980/07/18, 41 y.o.   MRN: [id number]  This visit was conducted in person.  BP 128/80   Pulse 94   Temp (!) 97.3 F (36.3 C) (Temporal)   Ht 5\' 1"  (1.549 m)   Wt 154 lb (69.9 kg)   LMP 07/20/2021   SpO2 98%   BMI 29.10 kg/m    CC: cough Subjective:   HPI: Jacqueline Skinner is a 41 y.o. female presenting on 08/13/2021 for Cough (C/o prod cough, diarrhea, vomiting, ST and white spots in back of throat. Sxs started 08/07/21. Neg home COVID test 08/11/21. )   1 wk h/o cough, over weekend developed white spots to back of throat and sore throat. Nausea, diarrhea, post tussive emesis, some chest congestion. Trouble sleeping due to cough. Coughing fits with sweating. PNdrainage. HA.   No fevers/chills, ear or tooth pain.   Tried mucinex expectorant/cough suppressant, raw honey, robitussin and nyquil with temporary benefit. Using cough drops.   Lives with husband and 4yo and 15yo.  No sick contacts at home Non smoker No h/o asthma   Home test negative for COVID 2d ago.  Tdap 2018.      Relevant past medical, surgical, family and social history reviewed and updated as indicated. Interim medical history since our last visit reviewed. Allergies and medications reviewed and updated. Outpatient Medications Prior to Visit  Medication Sig Dispense Refill   famotidine (PEPCID) 20 MG tablet TAKE 1 TABLET (20 MG TOTAL) BY MOUTH DAILY. FOR HEARTBURN. 90 tablet 3   fexofenadine (ALLEGRA) 180 MG tablet Take 180 mg by mouth daily.     fluticasone (FLONASE) 50 MCG/ACT nasal spray PLACE 1 SPRAY INTO BOTH NOSTRILS 2 (TWO) TIMES DAILY 16 mL 0   NIKKI 3-0.02 MG tablet Take 1 tablet by mouth daily.     propranolol ER (INDERAL LA) 80 MG 24 hr capsule TAKE 1 CAPSULE (80 MG TOTAL) BY MOUTH DAILY. FOR HEADACHE PREVENTION 90 capsule 3   No facility-administered medications prior to visit.     Per HPI unless specifically indicated in ROS section  below Review of Systems  Objective:  BP 128/80   Pulse 94   Temp (!) 97.3 F (36.3 C) (Temporal)   Ht 5\' 1"  (1.549 m)   Wt 154 lb (69.9 kg)   LMP 07/20/2021   SpO2 98%   BMI 29.10 kg/m   Wt Readings from Last 3 Encounters:  08/13/21 154 lb (69.9 kg)  06/08/21 154 lb (69.9 kg)  03/13/21 154 lb (69.9 kg)      Physical Exam Vitals and nursing note reviewed.  Constitutional:      Appearance: Normal appearance. She is not ill-appearing.  HENT:     Head: Normocephalic and atraumatic.     Right Ear: Tympanic membrane, ear canal and external ear normal. There is no impacted cerumen.     Left Ear: Tympanic membrane, ear canal and external ear normal. There is no impacted cerumen.     Nose: Nose normal. No congestion or rhinorrhea.     Mouth/Throat:     Mouth: Mucous membranes are moist.     Pharynx: Oropharynx is clear. Posterior oropharyngeal erythema present. No oropharyngeal exudate.     Comments: Marked soft palate and posterior oropharyngeal erythema without exudate Eyes:     Extraocular Movements: Extraocular movements intact.     Conjunctiva/sclera: Conjunctivae normal.     Pupils: Pupils are equal, round, and reactive to light.  Cardiovascular:     Rate and Rhythm: Normal rate and regular rhythm.     Pulses: Normal pulses.     Heart sounds: Normal heart sounds. No murmur heard. Pulmonary:     Effort: Pulmonary effort is normal. No respiratory distress.     Breath sounds: Normal breath sounds. No wheezing, rhonchi or rales.     Comments: Lungs clear, persistent dry cough present Lymphadenopathy:     Head:     Right side of head: No submental, submandibular, tonsillar, preauricular or posterior auricular adenopathy.     Left side of head: No submental, submandibular, tonsillar, preauricular or posterior auricular adenopathy.     Cervical: No cervical adenopathy.     Right cervical: No superficial cervical adenopathy.    Left cervical: No superficial cervical  adenopathy.     Upper Body:     Right upper body: No supraclavicular adenopathy.     Left upper body: No supraclavicular adenopathy.  Skin:    Findings: No rash.  Neurological:     Mental Status: She is alert.  Psychiatric:        Mood and Affect: Mood normal.        Behavior: Behavior normal.       Results for orders placed or performed in visit on 08/13/21  POCT rapid strep A  Result Value Ref Range   Rapid Strep A Screen Negative Negative   Assessment & Plan:  [place] CSRS reviewed.   Problem List Items Addressed This Visit     Acute respiratory infection - Primary    Story suspicious for pertussis - send nasopharyngeal collection for pertussis PCR.  Rx zpack antibiotic, cheratussin cough syrup with sedation precautions.  supportive measures reviewed. Red flags to seek further care reviewed. Pt agrees with plan.       Relevant Medications   azithromycin (ZITHROMAX) 250 MG tablet   Other Relevant Orders   Bordetella Pertussis PCR   POCT rapid strep A (Completed)   Other Visit Diagnoses     Cough, unspecified type       Relevant Orders   Bordetella Pertussis PCR        Meds ordered this encounter  Medications   azithromycin (ZITHROMAX) 250 MG tablet    Sig: Take 2 tablets on day 1, then 1 tablet daily on days 2 through 5    Dispense:  6 tablet    Refill:  0   guaiFENesin-codeine (CHERATUSSIN AC) 100-10 MG/5ML syrup    Sig: Take 5 mLs by mouth 3 (three) times daily as needed for cough.    Dispense:  120 mL    Refill:  0   Orders Placed This Encounter  Procedures   Bordetella Pertussis PCR   POCT rapid strep A     Patient Instructions  You have a upper respiratory infection, will send testing for pertussis. Strep test today negative Use medication as prescribed: Take azithromycin antibiotic sent to pharmacy. Take codeine cough syrup sent to pharmacy.  Push fluids and plenty of rest. Please return if you are not improving as expected, or if you have high  fevers (>101.5) or difficulty swallowing or worsening productive cough. Call clinic with questions.  Good to see you today. I hope you start feeling better soon.   Follow up plan: Return if symptoms worsen or fail to improve.  Eustaquio Boyden, MD

## 2021-08-13 NOTE — Assessment & Plan Note (Signed)
Story suspicious for pertussis - send nasopharyngeal collection for pertussis PCR.  Rx zpack antibiotic, cheratussin cough syrup with sedation precautions.  supportive measures reviewed. Red flags to seek further care reviewed. Pt agrees with plan.

## 2021-08-15 LAB — BORDETELLA PERTUSSIS PCR
B. PERTUSSIS DNA: NOT DETECTED
B. parapertussis DNA: NOT DETECTED

## 2021-08-22 DIAGNOSIS — K130 Diseases of lips: Secondary | ICD-10-CM

## 2021-08-23 MED ORDER — MUPIROCIN 2 % EX OINT: 1.0000 | TOPICAL_OINTMENT | Freq: Two times a day (BID) | CUTANEOUS | 0 refills | Status: DC | PRN

## 2021-12-19 ENCOUNTER — Encounter: Payer: Self-pay | Admitting: Nurse Practitioner

## 2021-12-19 ENCOUNTER — Ambulatory Visit (INDEPENDENT_AMBULATORY_CARE_PROVIDER_SITE_OTHER): Payer: BC Managed Care – PPO | Admitting: Nurse Practitioner

## 2021-12-19 VITALS — BP 120/74 | HR 97 | Temp 100.6°F | Resp 16 | Wt 156.0 lb

## 2021-12-19 DIAGNOSIS — J069 Acute upper respiratory infection, unspecified: Secondary | ICD-10-CM

## 2021-12-19 DIAGNOSIS — R52 Pain, unspecified: Secondary | ICD-10-CM | POA: Diagnosis not present

## 2021-12-19 LAB — POCT INFLUENZA A/B
Influenza A, POC: NEGATIVE
Influenza B, POC: NEGATIVE

## 2021-12-19 NOTE — Progress Notes (Signed)
Acute Office Visit  Subjective:     Patient ID: Jacqueline Skinner, female    DOB: 1980-04-27, 41 y.o.   MRN: [id number]  Chief Complaint  Patient presents with   Generalized Body Aches    Started on 12/18/21-fever 102 last night 101.7 this morning, achy lightheaded yesterday, slight congestion and cough. No sore throat. Has taking Nyquil.    HPI Patient is in today for Sick symptoms   Sympotms started on 12/18/2021 Started feeling bad approx 4pm yesterday evening. States she felt achy and had a fever of 102. States that she has a 40 year old that is in preschool States that her parents had covid around thanksgiving Covid test negative this [employer] x2 and no flu vaccine States that she has been using nyquill and dayquill and advil with some relief   Review of Systems  Constitutional:  Positive for chills, fever and malaise/fatigue.       Decreased appetite Fluid intake is good per patient   HENT:  Positive for sinus pain. Negative for ear discharge, ear pain and sore throat.   Respiratory:  Positive for cough and sputum production (yellow and thick).   Gastrointestinal:  Negative for abdominal pain, diarrhea, nausea and vomiting.  Musculoskeletal:  Positive for joint pain and myalgias.  Neurological:  Positive for headaches.        Objective:    BP 120/74   Pulse 97   Temp (!) 100.6 F (38.1 C)   Resp 16   Wt 156 lb (70.8 kg)   SpO2 98%   BMI 29.48 kg/m    Physical Exam Vitals and nursing note reviewed.  Constitutional:      Appearance: Normal appearance.  HENT:     Right Ear: Tympanic membrane, ear canal and external ear normal.     Left Ear: Tympanic membrane, ear canal and external ear normal.     Mouth/Throat:     Mouth: Mucous membranes are moist.     Pharynx: Oropharynx is clear.  Cardiovascular:     Rate and Rhythm: Normal rate and regular rhythm.     Heart sounds: Normal heart sounds.  Pulmonary:     Effort: Pulmonary effort is normal.      Breath sounds: Normal breath sounds.  Neurological:     Mental Status: She is alert.     No results found for any visits on 12/19/21.      Assessment & Plan:   Problem List Items Addressed This Visit       Respiratory   Viral upper respiratory tract infection    Patient is at home COVID test was negative along with flu test in office negative.  Do suspect it is too soon to test for COVID we will send off PCR swab to lab.  Patient instructed to quarantine until swab returns.  Patient does work at home and does not need a work note.  Patient can use over-the-counter treatments as needed.  She can alternate Tylenol with ibuprofen every 4 hours for aches pains and fevers.  Did encourage rest and plenty of fluids.        Other   Body aches - Primary    Flu test negative in office.  Pending send off COVID swab.  Patient can use over-the-counter analgesics as needed.  Rest and drink plenty of fluids.      Relevant Orders   POCT Influenza A/B   Novel Coronavirus, NAA ([facility])    No orders of the  defined types were placed in this encounter.   Return if symptoms worsen or fail to improve.  Audria Nine, NP

## 2021-12-19 NOTE — Assessment & Plan Note (Signed)
Patient is at home COVID test was negative along with flu test in office negative.  Do suspect it is too soon to test for COVID we will send off PCR swab to lab.  Patient instructed to quarantine until swab returns.  Patient does work at home and does not need a work note.  Patient can use over-the-counter treatments as needed.  She can alternate Tylenol with ibuprofen every 4 hours for aches pains and fevers.  Did encourage rest and plenty of fluids.

## 2021-12-19 NOTE — Patient Instructions (Signed)
Nice to see you today I will be in touch with the swab results once I have them I want you to quarantine until we get that swab back Follow up sooner if needed

## 2021-12-19 NOTE — Assessment & Plan Note (Signed)
Flu test negative in office.  Pending send off COVID swab.  Patient can use over-the-counter analgesics as needed.  Rest and drink plenty of fluids.

## 2021-12-20 ENCOUNTER — Ambulatory Visit: Payer: BC Managed Care – PPO | Admitting: Internal Medicine

## 2021-12-20 LAB — SPECIMEN STATUS REPORT

## 2021-12-20 LAB — NOVEL CORONAVIRUS, NAA: SARS-CoV-2, NAA: NOT DETECTED

## 2021-12-21 ENCOUNTER — Telehealth: Payer: Self-pay | Admitting: Nurse Practitioner

## 2021-12-21 NOTE — Telephone Encounter (Signed)
Patient advised.

## 2021-12-21 NOTE — Telephone Encounter (Signed)
Continue over the counter medications such as tylenol and ibuprofen for the fevers and aches. Rest and drink plenty of fluid. If she gets worse get evaluated over the weekend at an urgent care. If she is not improving by Monday she needs to let me know

## 2021-12-21 NOTE — Telephone Encounter (Signed)
-----   Message from [facility], [place] sent at 12/21/2021 12:21 PM EST ----- Patient advised. Patient is still running fever this morning 102, feels really tired, some body aches, sharp pains off and on in ears and head, congested still.

## 2021-12-25 ENCOUNTER — Telehealth: Payer: Self-pay | Admitting: Primary Care

## 2021-12-25 NOTE — Telephone Encounter (Signed)
Patient called in and stated she seen Matt last week for a virus. She stated that now she is experiencing nasal discharge, headaches, and her face hurts around her cheeks. She was wanting to know if he could call in an antibiotic for her. Please advise. Thank you!

## 2021-12-26 MED ORDER — AMOXICILLIN-POT CLAVULANATE 875-125 MG PO TABS: 1.0000 | ORAL_TABLET | Freq: Two times a day (BID) | ORAL | 0 refills | Status: AC

## 2021-12-26 NOTE — Telephone Encounter (Signed)
Antibiotic called in her pharmacy

## 2021-12-26 NOTE — Telephone Encounter (Signed)
Patient notified rx was sent in. 

## 2021-12-26 NOTE — Telephone Encounter (Signed)
I saw in previous TE that you said if patient does not get any better to let you know. Seems like she is getting worse; any advice?

## 2021-12-26 NOTE — Addendum Note (Signed)
Addended by: Eden Emms on: 12/26/2021 01:15 PM   Modules accepted: Orders

## 2022-02-03 IMAGING — CT CT HEAD W/O CM
4 series · 15 of 47 positions shown, 17 images · non-contrast
Comparison: None.

CLINICAL DATA: Chronic frontal headaches.

EXAM:
CT HEAD WITHOUT CONTRAST
TECHNIQUE: Contiguous axial images were obtained from the base of the skull
through the vertex without intravenous contrast.

[Series 2: axial st head 5.00 ax · axial · 0.35mm/px · z∈[-522,-430]mm · 7 of 27 slices shown, 9 images]
[im 4/27  brain]
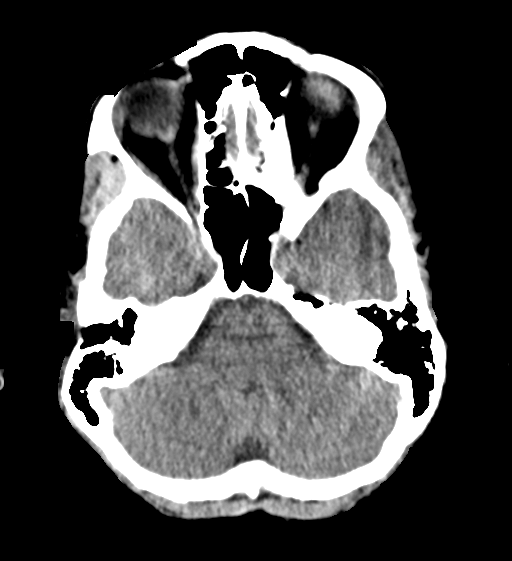
[im 4/27  bone]
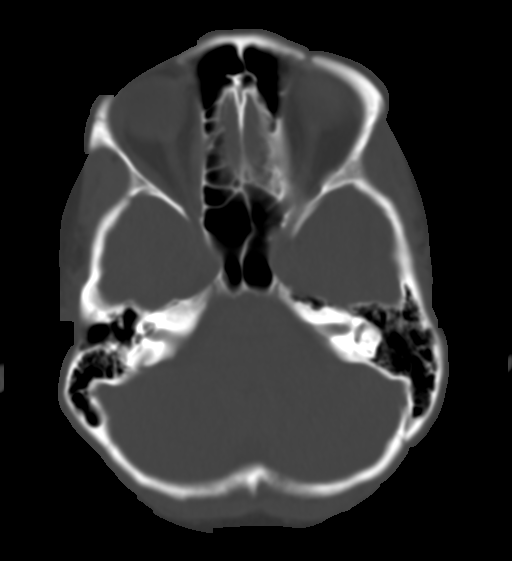
[im 7/27  brain]
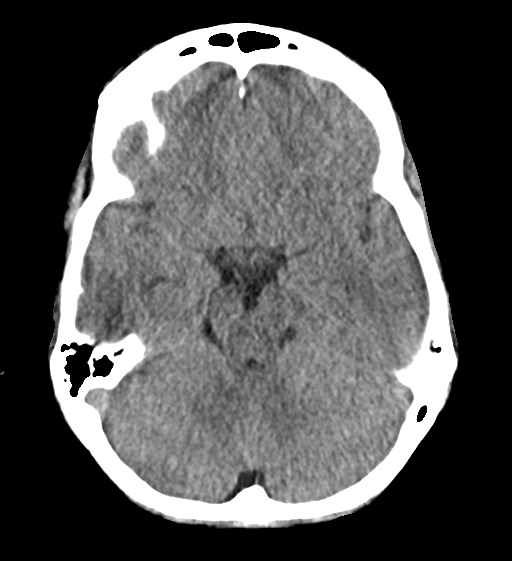
[im 10/27  brain]
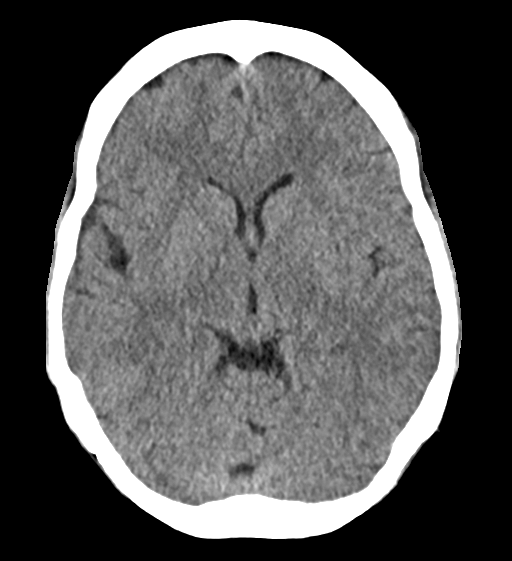
[im 14/27  brain]
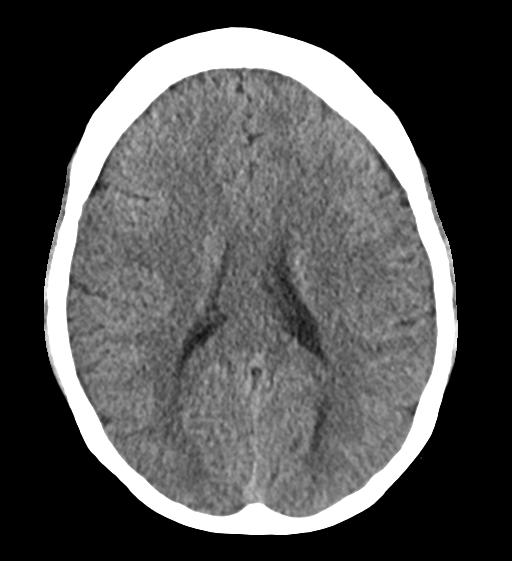
[im 17/27  brain]
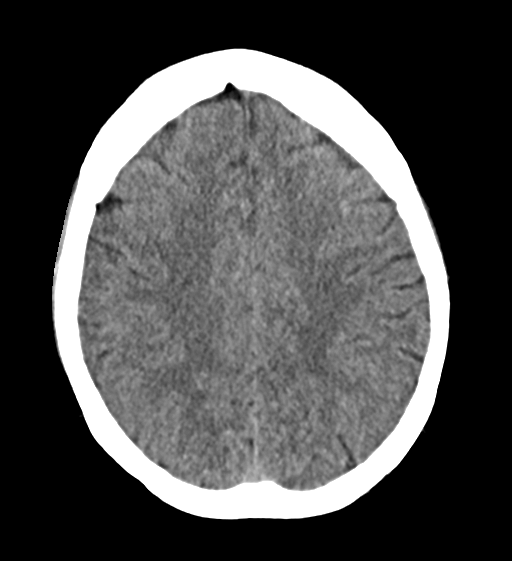
[im 17/27  bone]
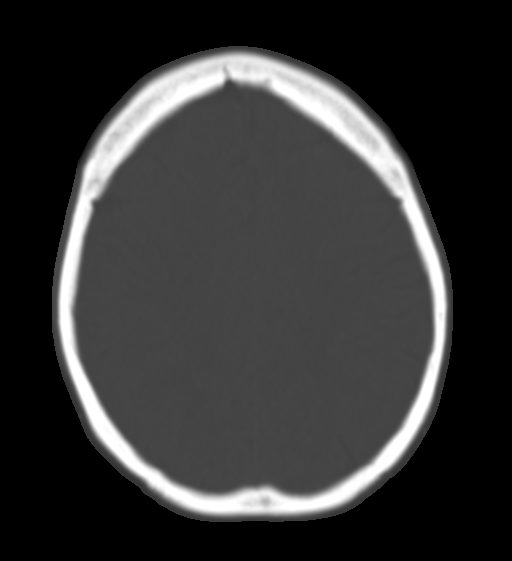
[im 20/27  brain]
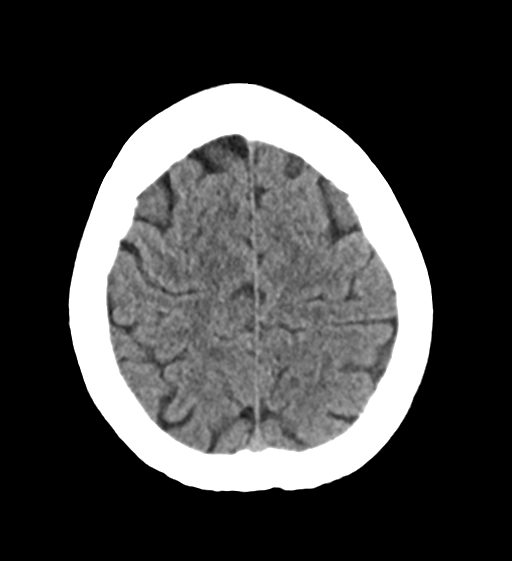
[im 23/27  brain]
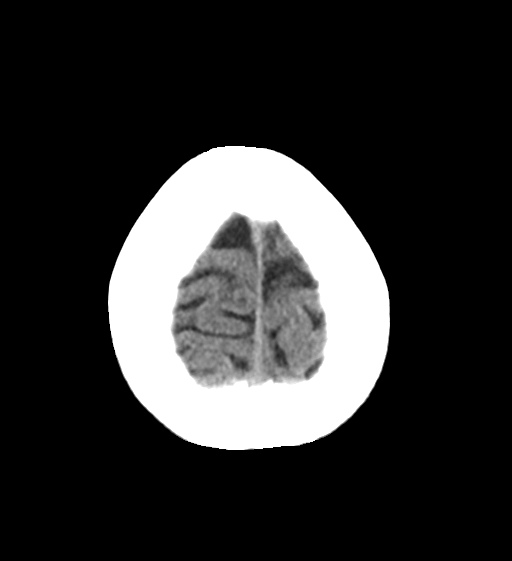

[Series 4: bone windows head 2.00 ax · axial · 0.35mm/px · z∈[-527,-513]mm · 2 of 67 slices shown]
[im 7/67  bone]
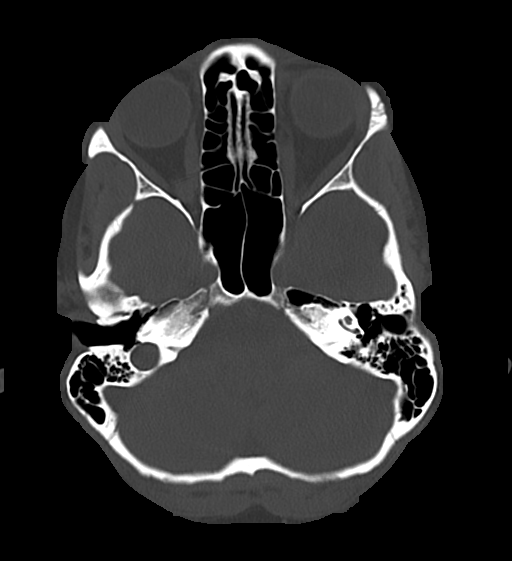
[im 14/67  bone]
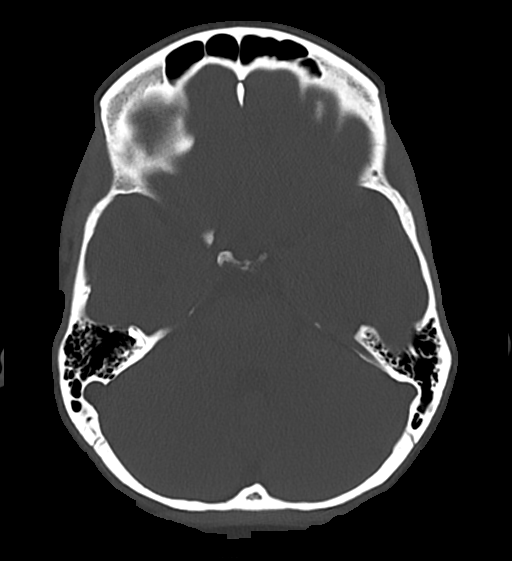

[Series 6: coronals head 3.00 cor · coronal · 0.26mm/px · 3 of 64 slices shown]
[im 22/64  brain]
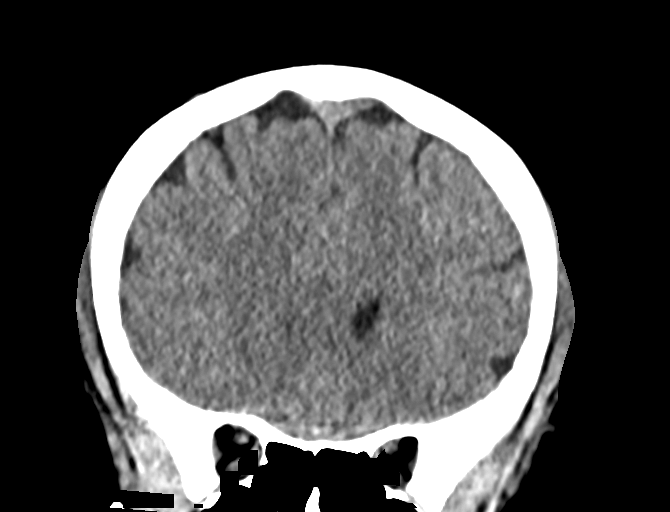
[im 29/64  brain]
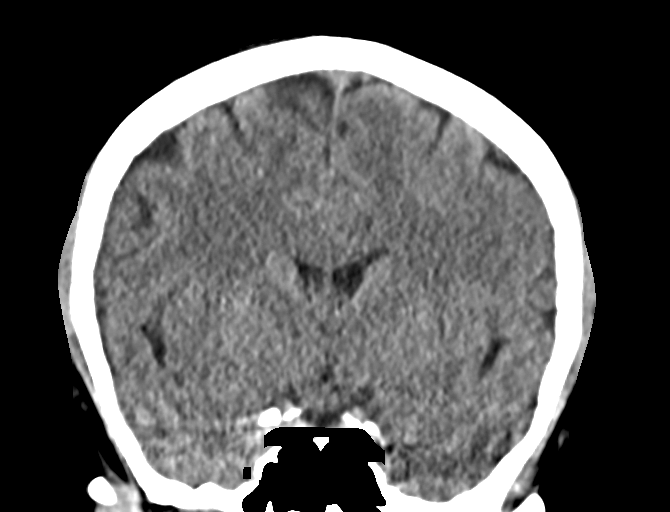
[im 36/64  brain]
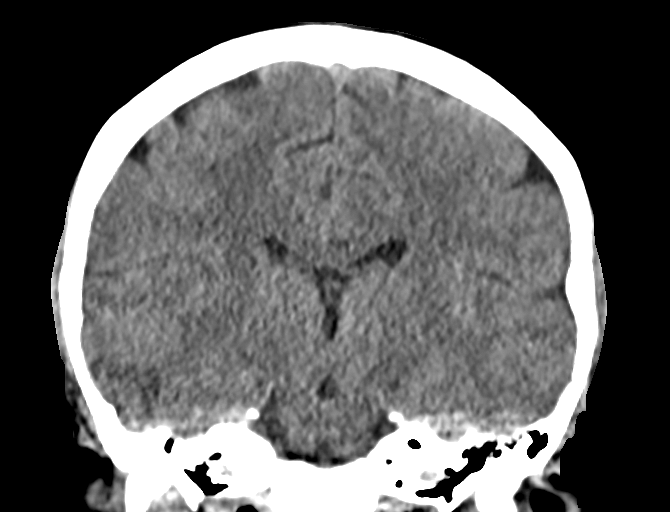

[Series 8: sagittals head 3.00 sag · sagittal · 0.26mm/px · 3 of 59 slices shown]
[im 20/59  brain]
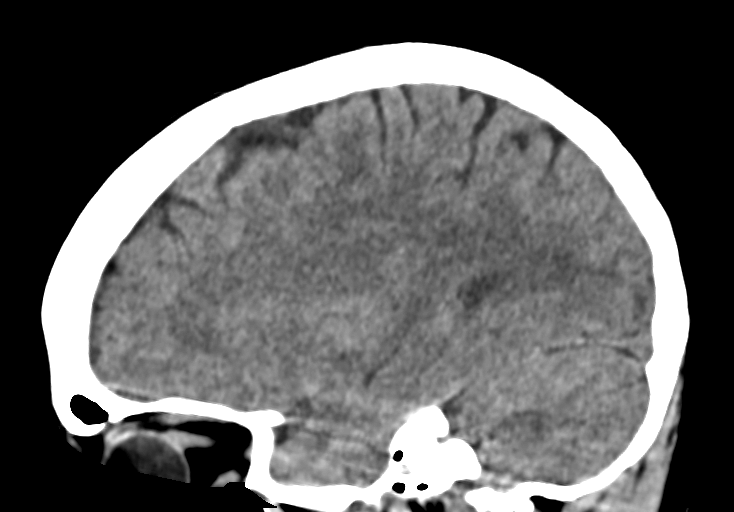
[im 30/59  brain]
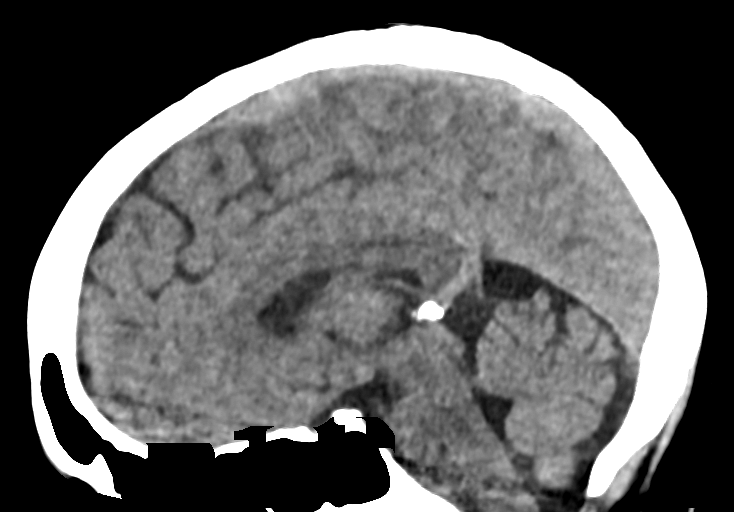
[im 39/59  brain]
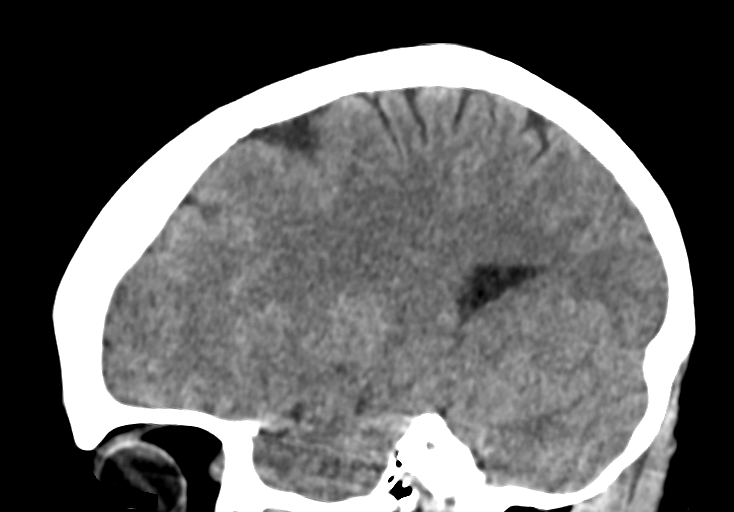

[15 of 47 positions shown; findings below may reference images not displayed]

FINDINGS: Brain: No acute intracranial abnormality. Specifically, no
hemorrhage, hydrocephalus, mass lesion, acute infarction, or
significant intracranial injury.

Vascular: No hyperdense vessel or unexpected calcification.

Skull: No acute calvarial abnormality.

Sinuses/Orbits: No acute findings

Other: None
IMPRESSION: Normal study.

## 2022-04-26 LAB — CBC AND DIFFERENTIAL
HCT: 36 (ref 36–46)
Hemoglobin: 12.2 (ref 12.0–16.0)
Platelets: 365 10*3/uL (ref 150–400)
WBC: 7.3

## 2022-04-26 LAB — CBC: RBC: 4.16 (ref 3.87–5.11)

## 2022-04-26 LAB — BASIC METABOLIC PANEL
BUN: 13 (ref 4–21)
CO2: 23 — AB (ref 13–22)
Chloride: 100 (ref 99–108)
Creatinine: 0.8 (ref 0.5–1.1)
Glucose: 78
Potassium: 4.8 mEq/L (ref 3.5–5.1)
Sodium: 135 — AB (ref 137–147)

## 2022-04-26 LAB — HEPATIC FUNCTION PANEL
ALT: 19 U/L (ref 7–35)
AST: 17 (ref 13–35)
Alkaline Phosphatase: 56 (ref 25–125)
Bilirubin, Direct: 0.1
Bilirubin, Total: 0.3

## 2022-04-26 LAB — TSH: TSH: 0.79 (ref 0.41–5.90)

## 2022-04-26 LAB — LIPID PANEL
Cholesterol: 221 — AB (ref 0–200)
HDL: 70 (ref 35–70)
LDL Cholesterol: 138
Triglycerides: 76 (ref 40–160)

## 2022-04-26 LAB — COMPREHENSIVE METABOLIC PANEL
Albumin: 4.2 (ref 3.5–5.0)
Calcium: 9.5 (ref 8.7–10.7)
eGFR: 98

## 2022-06-09 ENCOUNTER — Other Ambulatory Visit: Payer: Self-pay | Admitting: Primary Care

## 2022-06-09 DIAGNOSIS — K219 Gastro-esophageal reflux disease without esophagitis: Secondary | ICD-10-CM

## 2022-06-09 NOTE — Telephone Encounter (Signed)
Patient is overdue for CPE/follow up, this will be required prior to any further refills.  Please schedule, thank you!   

## 2022-06-10 NOTE — Telephone Encounter (Signed)
Spoke to pt, scheduled cpe for 06/20/22 

## 2022-06-13 ENCOUNTER — Other Ambulatory Visit: Payer: Self-pay | Admitting: Primary Care

## 2022-06-13 DIAGNOSIS — R519 Headache, unspecified: Secondary | ICD-10-CM

## 2022-06-20 ENCOUNTER — Encounter: Payer: Self-pay | Admitting: Primary Care

## 2022-06-20 ENCOUNTER — Ambulatory Visit (INDEPENDENT_AMBULATORY_CARE_PROVIDER_SITE_OTHER): Payer: BC Managed Care – PPO | Admitting: Primary Care

## 2022-06-20 VITALS — BP 124/82 | HR 63 | Temp 97.6°F | Ht 61.0 in | Wt 157.0 lb

## 2022-06-20 DIAGNOSIS — F411 Generalized anxiety disorder: Secondary | ICD-10-CM | POA: Insufficient documentation

## 2022-06-20 DIAGNOSIS — I10 Essential (primary) hypertension: Secondary | ICD-10-CM

## 2022-06-20 DIAGNOSIS — R519 Headache, unspecified: Secondary | ICD-10-CM

## 2022-06-20 DIAGNOSIS — K602 Anal fissure, unspecified: Secondary | ICD-10-CM

## 2022-06-20 DIAGNOSIS — O1003 Pre-existing essential hypertension complicating the puerperium: Secondary | ICD-10-CM

## 2022-06-20 DIAGNOSIS — Z0001 Encounter for general adult medical examination with abnormal findings: Secondary | ICD-10-CM

## 2022-06-20 DIAGNOSIS — K219 Gastro-esophageal reflux disease without esophagitis: Secondary | ICD-10-CM | POA: Diagnosis not present

## 2022-06-20 MED ORDER — SERTRALINE HCL 25 MG PO TABS: 25.0000 mg | ORAL_TABLET | Freq: Every day | ORAL | 0 refills | Status: DC

## 2022-06-20 MED ORDER — FAMOTIDINE 20 MG PO TABS: 20.0000 mg | ORAL_TABLET | Freq: Two times a day (BID) | ORAL | 3 refills | Status: DC

## 2022-06-20 NOTE — Assessment & Plan Note (Signed)
Uncontrolled, but she is drinking liquids that would cause symptoms.   Discussed common triggers for GERD.  Increase famotidine 20 mg BID. She will update.

## 2022-06-20 NOTE — Progress Notes (Signed)
Subjective:    Patient ID: Jacqueline Skinner, female    DOB: 22-Feb-1980, 42 y.o.   MRN: [id number]  HPI  Jacqueline Skinner is a very pleasant 42 y.o. female who presents today for complete physical and follow up of chronic conditions.  She would also like to discuss anxiety. Chronic for years, once on medication in college, isn't sure of the name but it caused her to feel drowsy. Current symptoms include feeling restless, feeling anxious, feeling nervous, anxiety around people. She will also develop physical symptoms of anxiety with flushing and redness to her skin. She would like to try treatment again.   She has noticed increased GERD symptoms which include burning mostly. She is compliant to famotidine 20 mg HS. She is taking Tums for symptoms at least 3-4 times weekly with temporary improvement. She drinks wine, coffee, and tea along with water during the day.   Immunizations: -Tetanus: Completed in 2018  Diet: Fair diet.  Exercise: No regular exercise.  Eye exam: Completes annually  Dental exam: Completes semi-annually    Pap Smear: March 2021, follows with GYN Mammogram: Completed in 2024 at GYN office.   BP Readings from Last 3 Encounters:  06/20/22 124/82  12/19/21 120/74  08/13/21 128/80       Review of Systems  Constitutional:  Negative for unexpected weight change.  HENT:  Positive for postnasal drip. Negative for rhinorrhea.   Respiratory:  Negative for cough and shortness of breath.   Cardiovascular:  Negative for chest pain.  Gastrointestinal:  Positive for constipation. Negative for diarrhea.       Rectal fissure  Genitourinary:  Negative for difficulty urinating.  Musculoskeletal:  Positive for arthralgias.  Skin:  Negative for rash.  Allergic/Immunologic: Positive for environmental allergies.  Neurological:  Negative for dizziness and headaches.  Psychiatric/Behavioral:  The patient is nervous/anxious.          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 ([facility])   Ear fullness, left 03/13/2021   Essential hypertension-postpartum    Hemorrhoid    PROM (premature rupture of membranes) 03/07/2017   SVD (spontaneous vaginal delivery) 03/07/2017    Social History   Socioeconomic History   Marital status: Married    Spouse name: Not on file   Number of children: Not on file   Years of education: Not on file   Highest education level: Not on file  Occupational History   Not on file  Tobacco Use   Smoking status: Former   Smokeless tobacco: Never  Substance and Sexual Activity   Alcohol use: Yes    Comment: occ   Drug use: No   Sexual activity: Yes  Other Topics Concern   Not on file  Social History Narrative   Married.   2 children.   Works in [employer] and [place].   Social Determinants of Health   Financial Resource Strain: Not on file  Food Insecurity: Not on file  Transportation Needs: Not on file  Physical Activity: Not on file  Stress: Not on file  Social Connections: Not on file  Intimate Partner Violence: Not on file    Past Surgical History:  Procedure Laterality Date   LEEP  2017   abnormal pap x2 with HR HPV, coplo LGSIL, rare cells +HGSIL (Dr Ellyn Hack)    Family History  Problem Relation Age of Onset   Diabetes Father  No Known Allergies  Current Outpatient Medications on File Prior to Visit  Medication Sig Dispense Refill   fexofenadine (ALLEGRA) 180 MG tablet Take 180 mg by mouth daily.     fluticasone (FLONASE) 50 MCG/ACT nasal spray PLACE 1 SPRAY INTO BOTH NOSTRILS 2 (TWO) TIMES DAILY 16 mL 0   NIKKI 3-0.02 MG tablet Take 1 tablet by mouth daily.     propranolol ER (INDERAL LA) 80 MG 24 hr capsule TAKE 1 CAPSULE (80 MG TOTAL) BY MOUTH DAILY. FOR HEADACHE PREVENTION 90 capsule 0   mupirocin ointment (BACTROBAN) 2 % Apply 1 Application topically 2 (two) times daily as needed. (Patient not taking:  Reported on 06/20/2022) 22 g 0   No current facility-administered medications on file prior to visit.    BP 124/82   Pulse 63   Temp 97.6 F (36.4 C) (Temporal)   Ht 5\' 1"  (1.549 m)   Wt 157 lb (71.2 kg)   SpO2 99%   BMI 29.66 kg/m  Objective:   Physical Exam HENT:     Right Ear: Tympanic membrane and ear canal normal.     Left Ear: Tympanic membrane and ear canal normal.     Nose: Nose normal.  Eyes:     Conjunctiva/sclera: Conjunctivae normal.     Pupils: Pupils are equal, round, and reactive to light.  Neck:     Thyroid: No thyromegaly.  Cardiovascular:     Rate and Rhythm: Normal rate and regular rhythm.     Heart sounds: No murmur heard. Pulmonary:     Effort: Pulmonary effort is normal.     Breath sounds: Normal breath sounds. No rales.  Abdominal:     General: Bowel sounds are normal.     Palpations: Abdomen is soft.     Tenderness: There is no abdominal tenderness.  Musculoskeletal:        General: Normal range of motion.     Cervical back: Neck supple.  Lymphadenopathy:     Cervical: No cervical adenopathy.  Skin:    General: Skin is warm and dry.     Findings: No rash.  Neurological:     Mental Status: She is alert and oriented to person, place, and time.     Cranial Nerves: No cranial nerve deficit.     Deep Tendon Reflexes: Reflexes are normal and symmetric.  Psychiatric:        Mood and Affect: Mood normal.           Assessment & Plan:  Encounter for annual general medical examination with abnormal findings in adult Assessment & Plan: Immunizations UTD. Pap smear UTD, follows with GYN Mammogram UTD, follows with GYN Discussed the importance of a healthy diet and regular exercise in order for weight loss, and to reduce the risk of further co-morbidity.  Exam stable. She will send labs from recent GI visit.  Follow up in 1 year for repeat physical.    Essential hypertension-postpartum Assessment & Plan: Controlled.  Remain off  treatment. Continue to monitor.   Gastroesophageal reflux disease, unspecified whether esophagitis present Assessment & Plan: Uncontrolled, but she is drinking liquids that would cause symptoms.   Discussed common triggers for GERD.  Increase famotidine 20 mg BID. She will update.   Orders: -     Famotidine; Take 1 tablet (20 mg total) by mouth 2 (two) times daily. For heartburn.  Dispense: 180 tablet; Refill: 3  Frequent headaches Assessment & Plan: Overall controlled.   Continue propranolol ER 80 mg  daily for prevention. Continue to monitor.    Rectal fissure Assessment & Plan: Following with GI.  Recommended to start Recticare cream. Try sitz baths.     GAD (generalized anxiety disorder) Assessment & Plan: Chronic, deteriorated.  Discussed options. She would like to try medication. Start Zoloft 25 mg daily.  We discussed possible side effects of headache, GI upset, drowsiness.  She will update in 4-6 weeks, especially if symptoms do not improve.  Orders: -     Sertraline HCl; Take 1 tablet (25 mg total) by mouth daily. For anxiety  Dispense: 90 tablet; Refill: 0        Doreene Nest, NP

## 2022-06-20 NOTE — Assessment & Plan Note (Signed)
Controlled.  Remain off treatment. Continue to monitor. 

## 2022-06-20 NOTE — Assessment & Plan Note (Signed)
Immunizations UTD. Pap smear UTD, follows with GYN Mammogram UTD, follows with GYN Discussed the importance of a healthy diet and regular exercise in order for weight loss, and to reduce the risk of further co-morbidity.  Exam stable. She will send labs from recent GI visit.  Follow up in 1 year for repeat physical.

## 2022-06-20 NOTE — Patient Instructions (Signed)
Start sertraline (Zoloft) 25 mg for anxiety and depression. Take 1/2 tablet by mouth once daily for about one week, then increase to 1 full tablet thereafter.   Increase your famotidine heartburn medication to twice daily. I sent a new prescription.  It was a pleasure to see you today!

## 2022-06-20 NOTE — Assessment & Plan Note (Signed)
Following with GI.  Recommended to start Recticare cream. Try sitz baths.

## 2022-06-20 NOTE — Assessment & Plan Note (Signed)
Overall controlled.   Continue propranolol ER 80 mg daily for prevention. Continue to monitor.

## 2022-06-20 NOTE — Assessment & Plan Note (Signed)
Chronic, deteriorated.  Discussed options. She would like to try medication. Start Zoloft 25 mg daily.  We discussed possible side effects of headache, GI upset, drowsiness.  She will update in 4-6 weeks, especially if symptoms do not improve.

## 2022-06-21 ENCOUNTER — Encounter: Payer: Self-pay | Admitting: Primary Care

## 2022-06-21 NOTE — Telephone Encounter (Signed)
Lab results have been abstracted into chart.   Can find documents in media section of chart

## 2022-06-21 NOTE — Telephone Encounter (Signed)
Can we abstract these labs?

## 2022-09-10 ENCOUNTER — Other Ambulatory Visit: Payer: Self-pay | Admitting: Primary Care

## 2022-09-10 DIAGNOSIS — R519 Headache, unspecified: Secondary | ICD-10-CM

## 2022-11-07 ENCOUNTER — Ambulatory Visit: Payer: BC Managed Care – PPO | Admitting: Family Medicine

## 2022-11-07 ENCOUNTER — Encounter: Payer: Self-pay | Admitting: Family Medicine

## 2022-11-07 VITALS — BP 120/76 | HR 70 | Temp 98.1°F | Ht 61.0 in | Wt 158.0 lb

## 2022-11-07 DIAGNOSIS — J029 Acute pharyngitis, unspecified: Secondary | ICD-10-CM | POA: Diagnosis not present

## 2022-11-07 DIAGNOSIS — J020 Streptococcal pharyngitis: Secondary | ICD-10-CM

## 2022-11-07 LAB — POCT RAPID STREP A (OFFICE): Rapid Strep A Screen: POSITIVE — AB

## 2022-11-07 MED ORDER — AMOXICILLIN 500 MG PO CAPS
1000.0000 mg | ORAL_CAPSULE | Freq: Two times a day (BID) | ORAL | 0 refills | Status: DC
Start: 2022-11-07 — End: 2023-03-11

## 2022-11-07 NOTE — Assessment & Plan Note (Signed)
Rapid strep test is positive.  Recommend ibuprofen 800 mg 3 times daily for sore throat pain.  Start amoxicillin 500 mg 2 tablets twice daily x 10 days. Symptomatic care. Push liquids to avoid dehydration.  Return and ER precautions provided.

## 2022-11-07 NOTE — Progress Notes (Signed)
Patient ID: Jacqueline Skinner, female    DOB: [birth year], 42 y.o.   MRN: [id number]  This visit was conducted in person.  BP 120/76   Pulse 70   Temp 98.1 F (36.7 C) (Oral)   Ht 5\' 1"  (1.549 m)   Wt 158 lb (71.7 kg)   LMP 10/08/2022   SpO2 99%   BMI 29.85 kg/m    CC:  Chief Complaint  Patient presents with   Sore Throat    C/o ST, HA and head congestion. Sxs started 11/04/22.     Subjective:   HPI: Jacqueline Skinner is a 42 y.o. female presenting on 11/07/2022 for Sore Throat (C/o ST, HA and head congestion. Sxs started 11/04/22. )   Date of onset:  3 days  Initial symptoms included ST, headache   Symptoms progressed to fatigue, worsened ST and  sinus pressure. Small post nasal drip.Marland Kitchen not blowing nose.  No cough. No fever.  NO SOB, no wheeze    Sick contacts:  son sick lately, congestion, no other exposur COVID testing:   none     She has tried to treat with  ibuprofen 400 mg four times daily  Generic sudafed medication   Helps temporarily.     No history of chronic lung disease such as asthma or COPD.  Former smoker.       Relevant past medical, surgical, family and social history reviewed and updated as indicated. Interim medical history since our last visit reviewed. Allergies and medications reviewed and updated. Outpatient Medications Prior to Visit  Medication Sig Dispense Refill   famotidine (PEPCID) 20 MG tablet Take 1 tablet (20 mg total) by mouth 2 (two) times daily. For heartburn. 180 tablet 3   fexofenadine (ALLEGRA) 180 MG tablet Take 180 mg by mouth daily.     fluticasone (FLONASE) 50 MCG/ACT nasal spray PLACE 1 SPRAY INTO BOTH NOSTRILS 2 (TWO) TIMES DAILY 16 mL 0   mupirocin ointment (BACTROBAN) 2 % Apply 1 Application topically 2 (two) times daily as needed. 22 g 0   NIKKI 3-0.02 MG tablet Take 1 tablet by mouth daily.     propranolol ER (INDERAL LA) 80 MG 24 hr capsule TAKE 1 CAPSULE (80 MG TOTAL) BY MOUTH DAILY. FOR HEADACHE  PREVENTION 90 capsule 2   sertraline (ZOLOFT) 25 MG tablet Take 1 tablet (25 mg total) by mouth daily. For anxiety 90 tablet 0   No facility-administered medications prior to visit.     Per HPI unless specifically indicated in ROS section below Review of Systems  Constitutional:  Negative for fatigue and fever.  HENT:  Positive for postnasal drip, sinus pressure, sore throat and trouble swallowing. Negative for congestion and rhinorrhea.   Eyes:  Negative for pain.  Respiratory:  Negative for cough and shortness of breath.   Cardiovascular:  Negative for chest pain, palpitations and leg swelling.  Gastrointestinal:  Negative for abdominal pain.  Genitourinary:  Negative for dysuria and vaginal bleeding.  Musculoskeletal:  Negative for back pain.  Neurological:  Positive for headaches. Negative for syncope and light-headedness.  Psychiatric/Behavioral:  Negative for dysphoric mood.    Objective:  BP 120/76   Pulse 70   Temp 98.1 F (36.7 C) (Oral)   Ht 5\' 1"  (1.549 m)   Wt 158 lb (71.7 kg)   LMP 10/08/2022   SpO2 99%   BMI 29.85 kg/m   Wt Readings from Last 3 Encounters:  11/07/22 158 lb (71.7 kg)  06/20/22 157  lb (71.2 kg)  12/19/21 156 lb (70.8 kg)      Physical Exam Constitutional:      General: She is not in acute distress.    Appearance: She is well-developed. She is not ill-appearing or toxic-appearing.  HENT:     Head: Normocephalic.     Right Ear: Hearing, ear canal and external ear normal. A middle ear effusion is present. Tympanic membrane is not erythematous, retracted or bulging.     Left Ear: Hearing, ear canal and external ear normal. A middle ear effusion is present. Tympanic membrane is not erythematous, retracted or bulging.     Nose: Mucosal edema and rhinorrhea present.     Right Sinus: No maxillary sinus tenderness or frontal sinus tenderness.     Left Sinus: No maxillary sinus tenderness or frontal sinus tenderness.     Mouth/Throat:     Mouth:  Mucous membranes are normal.     Pharynx: Uvula midline. Posterior oropharyngeal erythema present. No oropharyngeal exudate.  Eyes:     General: Lids are normal. Lids are everted, no foreign bodies appreciated.     Extraocular Movements: EOM normal.     Conjunctiva/sclera: Conjunctivae normal.     Pupils: Pupils are equal, round, and reactive to light.  Neck:     Thyroid: No thyroid mass or thyromegaly.     Vascular: No carotid bruit.     Trachea: Trachea normal.  Cardiovascular:     Rate and Rhythm: Normal rate and regular rhythm.     Pulses: Normal pulses.     Heart sounds: Normal heart sounds, S1 normal and S2 normal. No murmur heard.    No friction rub. No gallop.  Pulmonary:     Effort: Pulmonary effort is normal. No tachypnea or respiratory distress.     Breath sounds: Normal breath sounds. No decreased breath sounds, wheezing, rhonchi or rales.  Musculoskeletal:     Cervical back: Normal range of motion and neck supple.  Skin:    General: Skin is warm, dry and intact.     Findings: No rash.  Neurological:     Mental Status: She is alert.  Psychiatric:        Mood and Affect: Mood is not anxious or depressed.        Speech: Speech normal.        Behavior: Behavior normal. Behavior is cooperative.        Cognition and Memory: Cognition and memory normal.        Judgment: Judgment normal.       Results for orders placed or performed in visit on 11/07/22  POCT rapid strep A  Result Value Ref Range   Rapid Strep A Screen Positive (A) Negative    Assessment and Plan  Sore throat -     POCT rapid strep A  Strep pharyngitis Assessment & Plan:   Rapid strep test is positive.  Recommend ibuprofen 800 mg 3 times daily for sore throat pain.  Start amoxicillin 500 mg 2 tablets twice daily x 10 days. Symptomatic care. Push liquids to avoid dehydration.  Return and ER precautions provided.     Other orders -     Amoxicillin; Take 2 capsules (1,000 mg total) by mouth  2 (two) times daily.  Dispense: 40 capsule; Refill: 0    No follow-ups on file.   Kerby Nora, MD

## 2023-01-28 DIAGNOSIS — K602 Anal fissure, unspecified: Secondary | ICD-10-CM

## 2023-02-07 ENCOUNTER — Telehealth: Payer: Self-pay | Admitting: Gastroenterology

## 2023-02-07 NOTE — Telephone Encounter (Signed)
Good Afternoon Dr Lavon Paganini   Patient preferred provider  We have received a referral for patient to be seen for rectal fissure. Patient was previously seen with Dr Man at [facility] but prefers to transfer due to wanting a second opinion. Patient states you were also recommended by her sister, which whom is a patient of yours as well.   Records are available for review in epic. Please review and advise on scheduling.   Thank you

## 2023-02-11 ENCOUNTER — Encounter: Payer: Self-pay | Admitting: Gastroenterology

## 2023-02-11 NOTE — Telephone Encounter (Signed)
Please inform patient that my next available appointment is few months away unless I have a cancellation, if she is willing to wait, please schedule appointment.  Thank you

## 2023-03-11 ENCOUNTER — Encounter: Payer: Self-pay | Admitting: Gastroenterology

## 2023-03-11 ENCOUNTER — Ambulatory Visit (INDEPENDENT_AMBULATORY_CARE_PROVIDER_SITE_OTHER): Payer: BC Managed Care – PPO | Admitting: Gastroenterology

## 2023-03-11 VITALS — BP 110/68 | HR 68 | Ht 61.25 in | Wt 161.4 lb

## 2023-03-11 DIAGNOSIS — K644 Residual hemorrhoidal skin tags: Secondary | ICD-10-CM

## 2023-03-11 DIAGNOSIS — K6289 Other specified diseases of anus and rectum: Secondary | ICD-10-CM

## 2023-03-11 DIAGNOSIS — K601 Chronic anal fissure: Secondary | ICD-10-CM | POA: Diagnosis not present

## 2023-03-11 DIAGNOSIS — K625 Hemorrhage of anus and rectum: Secondary | ICD-10-CM

## 2023-03-11 DIAGNOSIS — K602 Anal fissure, unspecified: Secondary | ICD-10-CM

## 2023-03-11 MED ORDER — SUFLAVE 178.7 G PO SOLR: 1.0000 | Freq: Once | ORAL | 0 refills | Status: AC

## 2023-03-11 MED ORDER — AMBULATORY NON FORMULARY MEDICATION: 1 refills | Status: AC

## 2023-03-11 NOTE — Patient Instructions (Addendum)
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 You have been experiencing these symptoms for almost a year, with daily bleeding during bowel movements. Previous treatments have not been effective, and you have been referred for further evaluation. A rectal exam confirmed the presence of two fissures, and a colonoscopy has been scheduled to rule out other potential causes of bleeding.  YOUR PLAN:  -CHRONIC ANAL FISSURE: A chronic anal fissure is a small tear in the lining of the anus that causes pain and bleeding. You have been prescribed nitroglycerin ointment to be applied 3-4 times daily for 2-3 months. It is important to continue using the ointment even after symptoms improve to ensure complete healing. Additionally, you should take a psyllium fiber supplement (1 tablespoon in the morning and evening) or increase your dietary fiber intake through oatmeal, fruits, and vegetables.  -SKIN TAG: A skin tag is a small, benign growth of skin, often resulting from a previous hemorrhoid. Your skin tag is not causing significant concern at this time, so no immediate intervention is required. However, if it becomes problematic, surgery can be considered.  INSTRUCTIONS:  Please remember to apply the nitroglycerin ointment 3-4 times daily and continue even after symptoms improve. Take a psyllium fiber supplement or increase your dietary fiber intake. Your colonoscopy is scheduled for April 11, 2023, at 1:30 PM. Please schedule a follow-up appointment after the colonoscopy to review the results and adjust your treatment plan as necessary.   Due to recent changes in healthcare laws, you may see the results of your imaging and laboratory studies on MyChart before your provider has had a chance to review them.  We understand that in some cases there may be results that are confusing or concerning to you. Not all laboratory results come back in the same time frame and the  provider may be waiting for multiple results in order to interpret others.  Please give [place] 48 hours in order for your provider to thoroughly review all the results before contacting the office for clarification of your results.    [facility]'s information is below: Address: [address], [place], [place] [address]  [phone]    You will receive your bowel preparation through [facility], which ensures the lowest copay and home delivery, with outreach via text or call from an [phone] number. Please respond promptly to avoid rescheduling. If you are interested in alternative options or have any questions please contact them at [phone]  Your Provider Has Sent Your Bowel Prep Regimen To [facility] What to expect. [facility] will contact you to verify your information and collect your copay, if applicable. Enjoy the comfort of your home while we deliver your prescription to you, free of any shipping charges. Fast, FREE delivery or shipping. [facility] accepts all major insurance benefits and applies discounts & coupons  Have additional questions? [facility]'s patient care team is always here to help.  Chat: [email or web address] Call: [phone] Email: [email or web address] NCPDP: [id number] How will we contact you? Welcome Phone call  a Welcome text and a Checkout link in a text Texts you receive from [phone] Are Not Spam.   *To set up delivery, you must complete the checkout process via link or speak to one of our patient care representatives. If we are unable to reach you, your prescription may be delayed.  To avoid waiting on hold if you call. Utilize the secure chat feature and request [facility] call you to complete the transaction or  expedite your concerns.  I appreciate the  opportunity to care for you  Thank You   Marsa Aris , MD

## 2023-03-11 NOTE — Progress Notes (Signed)
 Jacqueline Skinner    [id number]    [birth year]  Primary Care Physician:Clark, Keane Scrape, NP  Referring Physician: Doreene Nest, NP [address] [place],  [place] [address]   Chief complaint:  Rectal bleeding  Discussed the use of AI scribe software for clinical note transcription with the patient, who gave verbal consent to proceed.  History of Present Illness   Jacqueline Skinner is a 43 year old female who presents with chronic anal fissures and rectal bleeding. She was referred by Dr. Loreta Ave for evaluation of anal fissures.  She has been experiencing anal fissures for almost a year, with one fissure being particularly problematic, appearing blister-like and associated with internal bleeding. Another fissure has developed on the opposite side, suspected to have been a cut initially. She experiences bleeding almost daily with bowel movements, though she does not consider herself constipated as she has regular bowel movements. Occasionally, she experiences mucus and rarely has diarrhea. She also reports infrequent stomach cramping, with a recent episode of radiating pain on her right side.  She initially consulted with Dr. Loreta Ave in October of the previous year, who performed a rectal exam and suspected a fissure. Over-the-counter Recticare, sitz baths, and a fiber supplement were recommended. However, she has not been consistent with the sitz baths and fiber intake due to her busy schedule with her children. Despite these measures, the condition did not improve. In October, she was prescribed a cream, possibly diltiazem with lidocaine, which she has been using twice daily without significant improvement.  She has been trying to increase her dietary fiber intake for both herself and her son, incorporating more oatmeal, fruits, and vegetables into her diet. She has been advised to take Colace as a stool softener.          Outpatient Encounter Medications as of 03/11/2023   Medication Sig   AMBULATORY NON FORMULARY MEDICATION Medication Name: Nitroglycerin ointment 0.125% use pea sized amount 2-3 times a day for 8 weeks   diltiazem 2 % GEL Apply 1 Application topically 2 (two) times daily. With lidocaine   famotidine (PEPCID) 20 MG tablet Take 1 tablet (20 mg total) by mouth 2 (two) times daily. For heartburn.   fexofenadine (ALLEGRA) 180 MG tablet Take 180 mg by mouth as needed.   fluticasone (FLONASE) 50 MCG/ACT nasal spray PLACE 1 SPRAY INTO BOTH NOSTRILS 2 (TWO) TIMES DAILY   NIKKI 3-0.02 MG tablet Take 1 tablet by mouth daily.   propranolol ER (INDERAL LA) 80 MG 24 hr capsule TAKE 1 CAPSULE (80 MG TOTAL) BY MOUTH DAILY. FOR HEADACHE PREVENTION   [EXPIRED] SUFLAVE 178.7 g SOLR Take 1 kit by mouth once for 1 dose.   [DISCONTINUED] amoxicillin (AMOXIL) 500 MG capsule Take 2 capsules (1,000 mg total) by mouth 2 (two) times daily.   [DISCONTINUED] mupirocin ointment (BACTROBAN) 2 % Apply 1 Application topically 2 (two) times daily as needed.   [DISCONTINUED] sertraline (ZOLOFT) 25 MG tablet Take 1 tablet (25 mg total) by mouth daily. For anxiety   No facility-administered encounter medications on file as of 03/11/2023.    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 ([facility])   Anal fissure    Anxiety    Ear fullness, left 03/13/2021   Essential hypertension-postpartum  GERD (gastroesophageal reflux disease)    Hemorrhoid    PROM (premature rupture of membranes) 03/07/2017   SVD (spontaneous vaginal delivery) 03/07/2017    Past Surgical History:  Procedure Laterality Date   LEEP  2017   abnormal pap x2 with HR HPV, coplo LGSIL, rare cells +HGSIL (Dr Ellyn Hack)    Family History  Problem Relation Age of Onset   Dementia Mother    Diabetes Father    Colon polyps Sister 29   Breast cancer Paternal Grandmother     Diabetes Paternal Grandfather    Pancreatic cancer Paternal Uncle        ?   Autism Son    ADD / ADHD Son     Social History   Socioeconomic History   Marital status: Married    Spouse name: Not on file   Number of children: 2   Years of education: Not on file   Highest education level: Not on file  Occupational History   Occupation: transaction coordinator/real estate  Tobacco Use   Smoking status: Former    Current packs/day: 0.00    Types: Cigarettes    Quit date: 2008    Years since quitting: 17.1   Smokeless tobacco: Never  Vaping Use   Vaping status: Never Used  Substance and Sexual Activity   Alcohol use: Yes    Comment: 3 per day   Drug use: No   Sexual activity: Yes  Other Topics Concern   Not on file  Social History Narrative   Married.   2 children.   Works in [employer] and [place].   Social Drivers of Corporate investment banker Strain: Not on file  Food Insecurity: Not on file  Transportation Needs: Not on file  Physical Activity: Not on file  Stress: Not on file  Social Connections: Not on file  Intimate Partner Violence: Not on file      Review of systems: All other review of systems negative except as mentioned in the HPI.   Physical Exam: Vitals:   03/11/23 0853  BP: 110/68  Pulse: 68   Body mass index is 30.24 kg/m. Gen:      No acute distress HEENT:  sclera anicteric CV: s1s2 rrr, no murmur Lungs: B/l clear. 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 Previous treatments with Recticare and diltiazem with lidocaine were ineffective. Differential diagnosis includes hemorrhoids, proctitis, colitis, inflammatory bowel  disease (ulcerative colitis or Crohn's), and rarely polyps. Reports daily bleeding with bowel movements and occasional cramping. Rectal exam confirmed two fissures. Colonoscopy needed to rule out other causes of bleeding. Surgery is a last resort with a 99.99% chance it will not be needed. First-line treatments include nitroglycerin and fiber supplements, with a healing time of 2-3 months. - Prescribe nitroglycerin ointment (lower strength) to be applied 3-4 times daily using a Q-tip or fingertip for 2-3 months, continuing even after symptom improvement to ensure complete healing. - Recommend psyllium fiber supplement (1 tablespoon in the morning and evening) or increase dietary fiber intake through oatmeal, fruits, and vegetables. - Schedule colonoscopy for April 11, 2023, at 1:30 PM to rule  out other potential causes of bleeding.  Skin Tag Skin tag near the anal fissure, likely residual from a previous hemorrhoid. Not causing significant concern. - Monitor the skin tag. No immediate intervention required. Surgery is an option if it becomes problematic.  Follow-up - Schedule follow-up appointment post-colonoscopy to review results and adjust treatment plan as necessary.        The patient was provided an opportunity to ask questions and all were answered. The patient agreed with the plan and demonstrated an understanding of the instructions.  Iona Beard , MD    CC: Doreene Nest, NP

## 2023-03-17 ENCOUNTER — Encounter: Payer: Self-pay | Admitting: Gastroenterology

## 2023-04-11 ENCOUNTER — Encounter: Payer: Self-pay | Admitting: Gastroenterology

## 2023-04-11 ENCOUNTER — Ambulatory Visit: Payer: BC Managed Care – PPO | Admitting: Gastroenterology

## 2023-04-11 VITALS — BP 117/72 | HR 65 | Temp 98.0°F | Resp 16 | Ht 61.0 in | Wt 161.0 lb

## 2023-04-11 DIAGNOSIS — K602 Anal fissure, unspecified: Secondary | ICD-10-CM

## 2023-04-11 DIAGNOSIS — K635 Polyp of colon: Secondary | ICD-10-CM

## 2023-04-11 DIAGNOSIS — D122 Benign neoplasm of ascending colon: Secondary | ICD-10-CM

## 2023-04-11 DIAGNOSIS — K648 Other hemorrhoids: Secondary | ICD-10-CM | POA: Diagnosis not present

## 2023-04-11 DIAGNOSIS — K625 Hemorrhage of anus and rectum: Secondary | ICD-10-CM

## 2023-04-11 MED ORDER — SODIUM CHLORIDE 0.9 % IV SOLN: 500.0000 mL | INTRAVENOUS | Status: DC

## 2023-04-11 NOTE — Progress Notes (Signed)
 Pt sedate, gd SR's, VSS, report to RN

## 2023-04-11 NOTE — Op Note (Signed)
 [facility] Patient Name: Jacqueline Skinner Procedure Date: 04/11/2023 2:29 PM MRN: [id number] Endoscopist: Napoleon Form , MD, [id number] Age: 43 Referring MD:  Date of Birth: July 26, 1980 Gender: Female Account #: [id number] Procedure:                Colonoscopy Indications:              Evaluation of unexplained GI bleeding presenting                            with Hematochezia, chronic non healing anal                            fissure, exclude IBD Medicines:                Monitored Anesthesia Care Procedure:                Pre-Anesthesia Assessment:                           - Prior to the procedure, a History and Physical                            was performed, and patient medications and                            allergies were reviewed. The patient's tolerance of                            previous anesthesia was also reviewed. The risks                            and benefits of the procedure and the sedation                            options and risks were discussed with the patient.                            All questions were answered, and informed consent                            was obtained. Prior Anticoagulants: The patient has                            taken no anticoagulant or antiplatelet agents. ASA                            Grade Assessment: II - A patient with mild systemic                            disease. After reviewing the risks and benefits,                            the patient was deemed in satisfactory condition to  undergo the procedure.                           After obtaining informed consent, the colonoscope                            was passed under direct vision. Throughout the                            procedure, the patient's blood pressure, pulse, and                            oxygen saturations were monitored continuously. The                            Olympus Scope [id number] was introduced  through the                            anus and advanced to the the cecum, identified by                            appendiceal orifice and ileocecal valve. The                            colonoscopy was performed without difficulty. The                            patient tolerated the procedure well. The quality                            of the bowel preparation was adequate. The                            ileocecal valve, appendiceal orifice, and rectum                            were photographed. Scope In: 2:35:52 PM Scope Out: 2:52:10 PM Scope Withdrawal Time: 0 hours 10 minutes 0 seconds  Total Procedure Duration: 0 hours 16 minutes 18 seconds  Findings:                 The perianal and digital rectal examinations were                            normal.                           A 5 mm polyp was found in the ascending colon. The                            polyp was sessile. The polyp was removed with a                            cold snare. Resection and retrieval were complete.  Normal mucosa was found in the entire colon.                           Non-bleeding internal hemorrhoids were found during                            retroflexion. The hemorrhoids were small.                           A less than 5 mm anal fissure was found in the anal                            canal. Complications:            No immediate complications. Estimated Blood Loss:     Estimated blood loss: none. Impression:               - One 5 mm polyp in the ascending colon, removed                            with a cold snare. Resected and retrieved.                           - Normal mucosa in the entire examined colon.                           - Non-bleeding internal hemorrhoids.                           - Anal fissure. Recommendation:           - Resume previous diet.                           - Continue present medications.                           - Await pathology  results.                           - Repeat colonoscopy in 5 years for surveillance                            based on pathology results.                           - Apply 0.25% Nitroglycerine three times daily per                            rectum X 2 months                           - Return to GI clinic in 2 months. Napoleon Form, MD 04/11/2023 2:59:09 PM This report has been signed electronically.

## 2023-04-11 NOTE — Patient Instructions (Signed)
 [employer]- Nitrogylcerin ointment   YOU HAD AN ENDOSCOPIC PROCEDURE TODAY AT THE [facility]:   Refer to the procedure report that was given to you for any specific questions about what was found during the examination.  If the procedure report does not answer your questions, please call your gastroenterologist to clarify.  If you requested that your care partner not be given the details of your procedure findings, then the procedure report has been included in a sealed envelope for you to review at your convenience later.  YOU SHOULD EXPECT: Some feelings of bloating in the abdomen. Passage of more gas than usual.  Walking can help get rid of the air that was put into your GI tract during the procedure and reduce the bloating. If you had a lower endoscopy (such as a colonoscopy or flexible sigmoidoscopy) you may notice spotting of blood in your stool or on the toilet paper. If you underwent a bowel prep for your procedure, you may not have a normal bowel movement for a few days.  Please Note:  You might notice some irritation and congestion in your nose or some drainage.  This is from the oxygen used during your procedure.  There is no need for concern and it should clear up in a day or so.  SYMPTOMS TO REPORT IMMEDIATELY:  Following lower endoscopy (colonoscopy or flexible sigmoidoscopy):  Excessive amounts of blood in the stool  Significant tenderness or worsening of abdominal pains  Swelling of the abdomen that is new, acute  Fever of 100F or higher  Following upper endoscopy (EGD)  Vomiting of blood or coffee ground material  New chest pain or pain under the shoulder blades  Painful or persistently difficult swallowing  New shortness of breath  Fever of 100F or higher  Black, tarry-looking stools  For urgent or emergent issues, a gastroenterologist can be reached at any hour by calling [phone]. Do not use MyChart messaging for urgent concerns.    DIET:   We do recommend a small meal at first, but then you may proceed to your regular diet.  Drink plenty of fluids but you should avoid alcoholic beverages for 24 hours.  ACTIVITY:  You should plan to take it easy for the rest of today and you should NOT DRIVE or use heavy machinery until tomorrow (because of the sedation medicines used during the test).    FOLLOW UP: Our staff will call the number listed on your records the next business day following your procedure.  We will call around 7:15- 8:00 am to check on you and address any questions or concerns that you may have regarding the information given to you following your procedure. If we do not reach you, we will leave a message.     If any biopsies were taken you will be contacted by phone or by letter within the next 1-3 weeks.  Please call us at [phone] if you have not heard about the biopsies in 3 weeks.    SIGNATURES/CONFIDENTIALITY: You and/or your care partner have signed paperwork which will be entered into your electronic medical record.  These signatures attest to the fact that that the information above on your After Visit Summary has been reviewed and is understood.  Full responsibility of the confidentiality of this discharge information lies with you and/or your care-partner.

## 2023-04-11 NOTE — Progress Notes (Signed)
 [facility] History and Physical   Primary Care Physician:  Doreene Nest, NP   Reason for Procedure:  Rectal bleeding, chronic non healing anal fissure, exclude IBD  Plan:    Colonoscopy with possible interventions as needed     HPI: Jacqueline Skinner is a very pleasant 43 y.o. female here for colonoscopy for evaluation of rectal bleeding, chronic non healing anal fissure, exclude IBD.   The risks and benefits as well as alternatives of endoscopic procedure(s) have been discussed and reviewed. All questions answered. The patient agrees to proceed.    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 Authorizing Provider  AMBULATORY NON FORMULARY MEDICATION Medication Name: Nitroglycerin ointment 0.125% use pea sized amount 2-3 times a day for 8 weeks 03/11/23  Yes Jasara Corrigan, Eleonore Chiquito, MD  cetirizine (ZYRTEC) 10 MG chewable tablet Chew 10 mg by mouth daily.   Yes [provider]  famotidine (PEPCID) 20 MG tablet Take 1 tablet (20 mg total) by mouth 2 (two) times daily. For heartburn. 06/20/22  Yes Doreene Nest, NP  NIKKI 3-0.02 MG tablet Take 1 tablet by mouth daily. 04/12/19  Yes [provider]  propranolol ER (INDERAL LA) 80 MG 24 hr capsule TAKE 1 CAPSULE (80 MG TOTAL) BY MOUTH DAILY. FOR HEADACHE PREVENTION 09/10/22  Yes Doreene Nest, NP  diltiazem 2 % GEL Apply 1  Application topically 2 (two) times daily. With lidocaine Patient not taking: Reported on 04/11/2023    [provider]  fexofenadine (ALLEGRA) 180 MG tablet Take 180 mg by mouth as needed. Patient not taking: Reported on 04/11/2023    [provider]  fluticasone (FLONASE) 50 MCG/ACT nasal spray PLACE 1 SPRAY INTO BOTH NOSTRILS 2 (TWO) TIMES DAILY 04/11/21   Doreene Nest, NP    Current Outpatient Medications  Medication Sig Dispense Refill   AMBULATORY NON FORMULARY MEDICATION Medication Name: Nitroglycerin ointment 0.125% use pea sized amount 2-3 times a day for 8 weeks 30 g 1   cetirizine (ZYRTEC) 10 MG chewable tablet Chew 10 mg by mouth daily.     famotidine (PEPCID) 20 MG tablet Take 1 tablet (20 mg total) by mouth 2 (two) times daily. For heartburn. 180 tablet 3   NIKKI 3-0.02 MG tablet Take 1 tablet by mouth daily.     propranolol ER (INDERAL LA) 80 MG 24 hr capsule TAKE 1 CAPSULE (80 MG TOTAL) BY MOUTH DAILY. FOR HEADACHE PREVENTION 90 capsule 2   diltiazem 2 % GEL Apply 1 Application topically 2 (two) times daily. With lidocaine (Patient not taking: Reported on 04/11/2023)     fexofenadine (ALLEGRA) 180 MG tablet Take 180 mg by mouth as needed. (Patient not taking: Reported on 04/11/2023)     fluticasone (FLONASE) 50 MCG/ACT nasal spray PLACE 1 SPRAY INTO BOTH NOSTRILS 2 (TWO) TIMES DAILY 16 mL 0  Current Facility-Administered Medications  Medication Dose Route Frequency Provider Last Rate Last Admin   0.9 %  sodium chloride infusion  500 mL Intravenous Continuous Raphael Fitzpatrick, Eleonore Chiquito, MD        Allergies as of 04/11/2023   (No Known Allergies)    Family History  Problem Relation Age of Onset   Dementia Mother    Diabetes Father    Pancreatic cancer Paternal Uncle        ?   Breast cancer Paternal Grandmother    Diabetes Paternal Grandfather    Autism Son    ADD / ADHD Son    Colon polyps Neg Hx    Colon cancer Neg Hx    Esophageal cancer Neg Hx     Stomach cancer Neg Hx    Rectal cancer Neg Hx     Social History   Socioeconomic History   Marital status: Married    Spouse name: Not on file   Number of children: 2   Years of education: Not on file   Highest education level: Not on file  Occupational History   Occupation: transaction coordinator/real estate  Tobacco Use   Smoking status: Former    Current packs/day: 0.00    Types: Cigarettes    Quit date: 2008    Years since quitting: 17.2   Smokeless tobacco: Never  Vaping Use   Vaping status: Never Used  Substance and Sexual Activity   Alcohol use: Yes    Alcohol/week: 21.0 standard drinks of alcohol    Types: 21 Glasses of wine per week    Comment: 3 per day   Drug use: No   Sexual activity: Yes  Other Topics Concern   Not on file  Social History Narrative   Married.   2 children.   Works in [employer] and [place].   Social Drivers of Corporate investment banker Strain: Not on file  Food Insecurity: Not on file  Transportation Needs: Not on file  Physical Activity: Not on file  Stress: Not on file  Social Connections: Not on file  Intimate Partner Violence: Not on file    Review of Systems:  All other review of systems negative except as mentioned in the HPI.  Physical Exam: Vital signs in last 24 hours: BP (!) 140/88   Pulse 76   Temp 98 F (36.7 C) (Skin)   Ht 5\' 1"  (1.549 m)   Wt 161 lb (73 kg)   SpO2 100%   BMI 30.42 kg/m  General:   Alert, NAD Lungs:  Clear .   Heart:  Regular rate and rhythm Abdomen:  Soft, nontender and nondistended. Neuro/Psych:  Alert and cooperative. Normal mood and affect. A and O x 3  Reviewed labs, radiology imaging, old records and pertinent past GI work up  Patient is appropriate for planned procedure(s) and anesthesia in an ambulatory setting   K. Scherry Ran , MD [phone]

## 2023-04-11 NOTE — Progress Notes (Signed)
 Called to room to assist during endoscopic procedure.  Patient ID and intended procedure confirmed with present staff. Received instructions for my participation in the procedure from the performing physician.

## 2023-04-14 ENCOUNTER — Telehealth: Payer: Self-pay

## 2023-04-14 NOTE — Telephone Encounter (Signed)
  Follow up Call-     04/11/2023    1:41 PM  Call back number  Post procedure Call Back phone  # [phone]  Permission to leave phone message Yes     Patient questions:  Do you have a fever, pain , or abdominal swelling? No. Pain Score  0 *  Have you tolerated food without any problems? Yes.    Have you been able to return to your normal activities? Yes.    Do you have any questions about your discharge instructions: Diet   No. Medications  No. Follow up visit  No.  Do you have questions or concerns about your Care? No.  Actions: * If pain score is 4 or above: No action needed, pain <4.

## 2023-04-16 LAB — SURGICAL PATHOLOGY

## 2023-05-31 ENCOUNTER — Emergency Department

## 2023-05-31 ENCOUNTER — Other Ambulatory Visit: Payer: Self-pay

## 2023-05-31 ENCOUNTER — Emergency Department
Admission: EM | Admit: 2023-05-31 | Discharge: 2023-05-31 | Disposition: A | Discharge status: Home / Self Care | Attending: Emergency Medicine | Admitting: Emergency Medicine

## 2023-05-31 DIAGNOSIS — R072 Precordial pain: Secondary | ICD-10-CM | POA: Diagnosis present

## 2023-05-31 DIAGNOSIS — R0789 Other chest pain: Secondary | ICD-10-CM

## 2023-05-31 LAB — CBC
HCT: 39.2 % (ref 36.0–46.0)
Hemoglobin: 13.3 g/dL (ref 12.0–15.0)
MCH: 29.6 pg (ref 26.0–34.0)
MCHC: 33.9 g/dL (ref 30.0–36.0)
MCV: 87.3 fL (ref 80.0–100.0)
Platelets: 395 10*3/uL (ref 150–400)
RBC: 4.49 MIL/uL (ref 3.87–5.11)
RDW: 12.3 % (ref 11.5–15.5)
WBC: 7.1 10*3/uL (ref 4.0–10.5)
nRBC: 0 % (ref 0.0–0.2)

## 2023-05-31 LAB — TROPONIN I (HIGH SENSITIVITY): Troponin I (High Sensitivity): 5 ng/L (ref <18)

## 2023-05-31 LAB — BASIC METABOLIC PANEL WITH GFR
Anion gap: 9 (ref 5–15)
BUN: 11 mg/dL (ref 6–20)
CO2: 24 mmol/L (ref 22–32)
Calcium: 9.4 mg/dL (ref 8.9–10.3)
Chloride: 101 mmol/L (ref 98–111)
Creatinine, Ser: 0.93 mg/dL (ref 0.44–1.00)
GFR, Estimated: >60 mL/min (ref >60)
Glucose, Bld: 87 mg/dL (ref 70–99)
Potassium: 4.1 mmol/L (ref 3.5–5.1)
Sodium: 134 mmol/L — ABNORMAL LOW (ref 135–145)

## 2023-05-31 LAB — D-DIMER, QUANTITATIVE: D-Dimer, Quant: <0.27 ug{FEU}/mL (ref 0.00–0.50)

## 2023-05-31 NOTE — ED Provider Notes (Signed)
 [facility] Provider Note    Event Date/Time   First MD Initiated Contact with Patient 05/31/23 1035     (approximate)   History   Chest Pain   HPI  Jacqueline Skinner is a 43 y.o. female with history of GERD and anxiety who presents with chest pain her last 2 days, substernal in location, nonradiating, dull in quality, and worse with movement or certain positions but not really exertional.  It is worse if she leans forward.  She states that it is pleuritic but denies feeling short of breath.  She denies feeling dizzy or lightheaded.  She has no new leg pain or swelling.  She denies cough or fever.  She has no prior history of this pain.  I reviewed the past medical records.  The patient's most recent outpatient encounter was with gastroenterology on 3/21 for evaluation of an anal fissure and rectal bleeding.  She has no recent ED visits or hospitalizations.   Physical Exam   Triage Vital Signs: ED Triage Vitals [05/31/23 1030]  Encounter Vitals Group     BP (!) 137/99     Systolic BP Percentile      Diastolic BP Percentile      Pulse Rate 72     Resp 18     Temp 98.6 F (37 C)     Temp Source Oral     SpO2 100 %     Weight 162 lb (73.5 kg)     Height 5\' 1"  (1.549 m)     Head Circumference      Peak Flow      Pain Score 5     Pain Loc      Pain Education      Exclude from Growth Chart     Most recent vital signs: Vitals:   05/31/23 1030 05/31/23 1100  BP: (!) 137/99 (!) 142/91  Pulse: 72 70  Resp: 18 16  Temp: 98.6 F (37 C)   SpO2: 100% 100%     General: Alert, well-appearing, no distress.  CV:  Good peripheral perfusion.  Normal heart sounds. Resp:  Normal effort.  Lungs CTAB. Abd:  No distention.  Other:  No calf or popliteal swelling or tenderness.   ED Results / Procedures / Treatments   Labs (all labs ordered are listed, but only abnormal results are displayed) Labs Reviewed  BASIC METABOLIC PANEL WITH GFR - Abnormal;  Notable for the following components:      Result Value   Sodium 134 (*)    All other components within normal limits  CBC  D-DIMER, QUANTITATIVE  POC URINE PREG, ED  TROPONIN I (HIGH SENSITIVITY)  TROPONIN I (HIGH SENSITIVITY)     EKG  ED ECG REPORT I, Lind Repine, the attending physician, personally viewed and interpreted this ECG.  Date: 05/31/2023 EKG Time: 1032 Rate: Send 3 Rhythm: normal sinus rhythm QRS Axis: normal Intervals: normal ST/T Wave abnormalities: Nonspecific T wave abnormalities Narrative Interpretation: no evidence of acute ischemia    RADIOLOGY  Chest x-ray: I independently viewed and interpreted the images; there is no focal consolidation or edema   PROCEDURES:  Critical Care performed: No  Procedures   MEDICATIONS ORDERED IN ED: Medications - No data to display   IMPRESSION / MDM / ASSESSMENT AND PLAN / ED COURSE  I reviewed the triage vital signs and the nursing notes.  43 year old female with PMH as noted above presents with atypical, nonexertional chest pain over the  last 1 to 2 days which is positional and pleuritic.  On exam her vital signs are normal and she is overall well-appearing.  Physical exam is unremarkable.  EKG is nonischemic.  Differential diagnosis includes, but is not limited to, musculoskeletal chest wall pain, GERD, other benign etiology, less likely ACS.  There is no clinical evidence to suggest aortic dissection or other vascular cause.  The patient states she has minimal pain when lying down.  PE is also on the differential although my overall suspicion is low.  However the patient is on birth control.  We will obtain basic labs, cardiac enzymes, D-dimer, chest x-ray, and reassess.  Patient's presentation is most consistent with acute complicated illness / injury requiring diagnostic workup.  The patient is on the cardiac monitor to evaluate for evidence of arrhythmia and/or significant heart rate changes.    ----------------------------------------- 12:18 PM on 05/31/2023 -----------------------------------------  Troponin is negative.  Based on the duration of the symptoms and the overall low risk for ACS, there is no indication for repeat.  D-dimer is also negative.  There is no evidence of PE.  BMP and CBC are unremarkable.  The patient has not had any recurrence of chest pain and remains comfortable.  She is stable for discharge home at this time.  I counseled her on the results of the workup.  I recommend that she follow-up with her primary care provider.  I gave strict return precautions, and she expresses understanding.   FINAL CLINICAL IMPRESSION(S) / ED DIAGNOSES   Final diagnoses:  Atypical chest pain     Rx / DC Orders   ED Discharge Orders     None        Note:  This document was prepared using Dragon voice recognition software and may include unintentional dictation errors.    Lind Repine, MD 05/31/23 1219

## 2023-05-31 NOTE — ED Triage Notes (Signed)
 Patient states chest pain since yesterday; went to UC today and sent over for abnormal EKG.

## 2023-05-31 NOTE — Discharge Instructions (Signed)
 You may take ibuprofen  or naproxen as needed for the pain.  Follow-up with your primary care provider.  Return to the ER for new, worsening, or persistent severe chest pain, difficulty breathing, weakness or lightheadedness, or any other new or worsening symptoms that concern you.

## 2023-06-03 ENCOUNTER — Ambulatory Visit: Payer: Self-pay | Admitting: Gastroenterology

## 2023-06-10 ENCOUNTER — Ambulatory Visit (INDEPENDENT_AMBULATORY_CARE_PROVIDER_SITE_OTHER): Admitting: Gastroenterology

## 2023-06-10 ENCOUNTER — Encounter: Payer: Self-pay | Admitting: Gastroenterology

## 2023-06-10 VITALS — BP 100/60 | HR 76 | Ht 61.0 in | Wt 160.8 lb

## 2023-06-10 DIAGNOSIS — K625 Hemorrhage of anus and rectum: Secondary | ICD-10-CM

## 2023-06-10 DIAGNOSIS — K5904 Chronic idiopathic constipation: Secondary | ICD-10-CM

## 2023-06-10 DIAGNOSIS — K5902 Outlet dysfunction constipation: Secondary | ICD-10-CM

## 2023-06-10 DIAGNOSIS — M6289 Other specified disorders of muscle: Secondary | ICD-10-CM

## 2023-06-10 DIAGNOSIS — K602 Anal fissure, unspecified: Secondary | ICD-10-CM

## 2023-06-10 DIAGNOSIS — K6289 Other specified diseases of anus and rectum: Secondary | ICD-10-CM

## 2023-06-10 MED ORDER — AMBULATORY NON FORMULARY MEDICATION: 0 refills | Status: AC

## 2023-06-10 NOTE — Patient Instructions (Addendum)
 VISIT SUMMARY:  During today's visit, we discussed your ongoing issues with chronic anal fissures, which have been causing you significant pain and occasional bleeding. We reviewed your current treatment plan and made some adjustments to help improve your symptoms and promote healing.  YOUR PLAN:  -CHRONIC ANAL FISSURE: A chronic anal fissure is a small tear in the lining of the anus that causes pain and bleeding, especially during bowel movements. To help manage this, we will increase your nitroglycerin ointment to 0.4% strength and apply it three to four times daily. If you experience headaches, reduce the frequency of application. Start taking Benefiber, one tablespoon with meals, two to three times daily, and add a stool softener like Colace or Docusate at bedtime if needed. Avoid excessive wiping and use moist wipes, ensuring the area is dried thoroughly after bowel movements. We will also refer you to pelvic floor physical therapy to help prevent recurrence.  INSTRUCTIONS:  Increase nitroglycerin ointment to 0.4% strength and apply three to four times daily. Start Benefiber supplementation, one tablespoon with meals, two to three times daily. Add a stool softener at bedtime if needed. Avoid excessive wiping and use moist wipes, drying the area thoroughly after bowel movements. Follow up with pelvic floor physical therapy to improve pelvic floor coordination and prevent recurrence.  Due to recent changes in healthcare laws, you may see the results of your imaging and laboratory studies on MyChart before your provider has had a chance to review them.  We understand that in some cases there may be results that are confusing or concerning to you. Not all laboratory results come back in the same time frame and the provider may be waiting for multiple results in order to interpret others.  Please give us  48 hours in order for your provider to thoroughly review all the results before contacting the office  for clarification of your results.    I appreciate the  opportunity to care for you  Thank You   Kavitha Nandigam , MD

## 2023-06-10 NOTE — Progress Notes (Unsigned)
 Jacqueline Skinner    [id number]    [birth year]  Primary Care Physician:Clark, Murlean Armour, NP  Referring Physician: Gabriel John, NP [id number] [place],  [place] [address]   Chief complaint:  Rectal bleeding   Discussed the use of AI scribe software for clinical note transcription with the patient, who gave verbal consent to proceed.  History of Present Illness Jacqueline Skinner is a 43 year old female with chronic anal fissures who presents with persistent anal pain and bleeding.  She has been experiencing worsening anal fissures over the past three months, characterized by periods of slight improvement followed by exacerbation, particularly after bowel movements. The fissures cause significant pain during defecation and sometimes lead to a sensation of incomplete evacuation due to anticipated pain.  She is currently using nitroglycerin ointment at a concentration of 0.2% twice daily, but finds it insufficient for healing. There is some uncertainty about whether she is using the 0.2% or 0.4% concentration. She has not yet tried Benefiber to help with stool consistency.  Her bowel movements occur daily, typically in the morning after coffee. She uses [place] wipes and attempts to dry the area with toilet paper. She is concerned about potential infection from diarrhea but understands the low likelihood of infection from the fissure.  She recalls previous use of diltiazem before switching to nitroglycerin. She is concerned about the fissure healing and then recurring, impacting her quality of life.  No consistent use of Benefiber or stool softeners.   Colonoscopy 04/11/23 - One 5 mm polyp in the ascending colon, removed with a cold snare. Resected and retrieved. - Normal mucosa in the entire examined colon. - Non- bleeding internal hemorrhoids. - Anal fissure.   Outpatient Encounter Medications as of 06/10/2023  Medication Sig   AMBULATORY NON FORMULARY  MEDICATION Medication Name: Nitroglycerin ointment 0.125% use pea sized amount 2-3 times a day for 8 weeks   cetirizine (ZYRTEC) 10 MG chewable tablet Chew 10 mg by mouth daily.   diltiazem 2 % GEL Apply 1 Application topically 2 (two) times daily. With lidocaine    famotidine  (PEPCID ) 20 MG tablet Take 1 tablet (20 mg total) by mouth 2 (two) times daily. For heartburn.   fexofenadine (ALLEGRA) 180 MG tablet Take 180 mg by mouth as needed.   fluticasone  (FLONASE ) 50 MCG/ACT nasal spray PLACE 1 SPRAY INTO BOTH NOSTRILS 2 (TWO) TIMES DAILY   NIKKI 3-0.02 MG tablet Take 1 tablet by mouth daily.   propranolol  ER (INDERAL  LA) 80 MG 24 hr capsule TAKE 1 CAPSULE (80 MG TOTAL) BY MOUTH DAILY. FOR HEADACHE PREVENTION   No facility-administered encounter medications on file as of 06/10/2023.    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   Breast cancer Paternal Grandmother    Diabetes Paternal Grandfather    Autism Son    ADD / ADHD Son    Colon polyps Neg Hx    Colon cancer Neg Hx    Esophageal cancer Neg Hx    Stomach cancer Neg Hx    Rectal cancer Neg Hx     Social History   Socioeconomic History   Marital status: Married    Spouse name: Not on file   Number of children: 2   Years of education: Not on file    Highest education level: Not on file  Occupational History   Occupation: transaction coordinator/real estate  Tobacco Use   Smoking status: Former    Current packs/day: 0.00    Types: Cigarettes    Quit date: 2008    Years since quitting: 17.3   Smokeless tobacco: Never  Vaping Use   Vaping status: Never Used  Substance and Sexual Activity   Alcohol use: Yes    Alcohol/week: 21.0 standard drinks of alcohol    Types: 21 Glasses of wine per week    Comment: 3 per day   Drug use: No   Sexual activity: Yes  Other Topics Concern   Not on file  Social History Narrative   Married.   2 children.   Works in [employer] and [place].   Social Drivers of Corporate investment banker Strain: Not on file  Food Insecurity: Not on file  Transportation Needs: Not on file  Physical Activity: Not on file  Stress: Not on file  Social Connections: Not on file  Intimate Partner Violence: Not on file      Review of systems: All other review of systems negative except as mentioned in the HPI.   Physical Exam: Vitals:   06/10/23 0936  BP: 100/60  Pulse: 76   Body mass index is 30.38 kg/m. Gen:      No acute distress HEENT:  sclera anicteric CV: s1s2 rrr, no murmur Lungs: B/l clear. Abd:      soft, non-tender; no palpable masses, no distension Ext:    No edema Neuro: alert and oriented x 3 Psych: normal mood and affect  Data Reviewed:  Reviewed labs, radiology imaging, old records and pertinent past GI work up     Assessment and Plan Assessment & Plan Chronic anal fissure Chronic anal fissure persisting for approximately three months with intermittent healing and recurrence, causing pain and occasional bleeding during bowel movements. Current treatment with 0.2% nitroglycerin ointment is insufficient. Consideration of increasing nitroglycerin to 0.4% or increasing application frequency. Discussed potential side effects of nitroglycerin, including headaches and dizziness.  Alternative treatments such as Botox injections and surgery were discussed but not recommended due to potential complications like fecal incontinence. Diltiazem was considered as an alternative topical treatment. Emphasized the importance of stool softeners and fiber supplementation to aid in healing. Discussed the role of pelvic floor physical therapy in preventing recurrence. Advised against excessive wiping and recommended use of moist wipes to prevent further irritation. - Increase nitroglycerin ointment to 0.4% strength. If headaches occur, reduce application frequency. - Apply nitroglycerin ointment three to four times daily - Initiate Benefiber supplementation, one tablespoon with meals, two to three times daily. - Add stool softener (Colace, Docusate) at bedtime as needed. - Avoid excessive wiping and use of moist wipes; dry area thoroughly after bowel movements. -  Refer to pelvic floor physical therapy to improve pelvic floor coordination and prevent recurrence.     The patient was provided an opportunity to ask questions and all were answered. The patient agreed with the plan and demonstrated an understanding of the instructions.  Lorena Rolling , MD    CC: Gabriel John, NP

## 2023-06-14 ENCOUNTER — Other Ambulatory Visit: Payer: Self-pay | Admitting: Primary Care

## 2023-06-14 DIAGNOSIS — R519 Headache, unspecified: Secondary | ICD-10-CM

## 2023-06-15 NOTE — Telephone Encounter (Signed)
Patient is due for CPE/follow up in early June, this will be required prior to any further refills.  Please schedule, thank you!   

## 2023-06-17 ENCOUNTER — Ambulatory Visit: Admitting: Primary Care

## 2023-06-17 ENCOUNTER — Ambulatory Visit: Payer: Self-pay | Admitting: Primary Care

## 2023-06-17 ENCOUNTER — Encounter: Payer: Self-pay | Admitting: Gastroenterology

## 2023-06-17 ENCOUNTER — Encounter: Payer: Self-pay | Admitting: Primary Care

## 2023-06-17 VITALS — BP 136/80 | HR 79 | Temp 98.4°F | Ht 61.0 in | Wt 162.0 lb

## 2023-06-17 DIAGNOSIS — J029 Acute pharyngitis, unspecified: Secondary | ICD-10-CM

## 2023-06-17 LAB — POCT RAPID STREP A (OFFICE): Rapid Strep A Screen: NEGATIVE

## 2023-06-17 LAB — POC COVID19 BINAXNOW: SARS Coronavirus 2 Ag: NEGATIVE

## 2023-06-17 NOTE — Progress Notes (Signed)
 Subjective:    Patient ID: Jacqueline Skinner, female    DOB: May 02, 1980, 43 y.o.   MRN: [id number]  Sore Throat  Associated symptoms include headaches.    Jacqueline Skinner is a very pleasant 43 y.o. female with a history of strep pharyngitis, seasonal allergies, frequent headaches who presents today to discuss sore throat.  Symptom onset three days ago with sore throat, post nasal drip, headaches, ear fullness, mild cough from drainage. She denies fevers, chills. Her son has been sick recently, had an ear and eye infection.   She's been taking Dayquil and Nyquil and Advil  with some help. She's not been taking her Allegra or using Flonase .    Review of Systems  Constitutional:  Negative for fever.  HENT:  Positive for postnasal drip and sore throat. Negative for sneezing.   Eyes:  Negative for itching.  Neurological:  Positive for headaches.         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 ([facility])   Anal fissure    Anxiety    Ear fullness, left 03/13/2021   Essential hypertension-postpartum    GERD (gastroesophageal reflux disease)    Hemorrhoid    PROM (premature rupture of membranes) 03/07/2017   SVD (spontaneous vaginal delivery) 03/07/2017    Social History   Socioeconomic History   Marital status: Married    Spouse name: Not on file   Number of children: 2   Years of education: Not on file   Highest education level: Not on file  Occupational History   Occupation: transaction coordinator/real estate  Tobacco Use   Smoking status: Former    Current packs/day: 0.00    Types: Cigarettes    Quit date: 2008    Years since quitting: 17.4   Smokeless tobacco: Never  Vaping Use   Vaping status: Never Used  Substance and Sexual Activity   Alcohol use: Yes    Alcohol/week: 21.0 standard drinks of alcohol    Types: 21 Glasses of wine per week    Comment: 3 per day    Drug use: No   Sexual activity: Yes  Other Topics Concern   Not on file  Social History Narrative   Married.   2 children.   Works in [employer] and [place].   Social Drivers of Corporate investment banker Strain: Not on file  Food Insecurity: Not on file  Transportation Needs: Not on file  Physical Activity: Not on file  Stress: Not on file  Social Connections: Not on file  Intimate Partner Violence: Not on file    Past Surgical History:  Procedure Laterality Date   LEEP  2017   abnormal pap x2 with HR HPV, coplo LGSIL, rare cells +HGSIL (Dr Donelda Fujita)    Family History  Problem Relation Age of Onset   Dementia Mother    Diabetes Father    Pancreatic cancer Paternal Uncle        ?   Breast cancer Paternal Grandmother    Diabetes Paternal Grandfather    Autism Son    ADD / ADHD Son    Colon polyps Neg Hx    Colon cancer Neg Hx    Esophageal cancer Neg Hx    Stomach cancer Neg Hx    Rectal cancer Neg Hx     No Known Allergies  Current Outpatient Medications on File Prior to Visit  Medication Sig  Dispense Refill   AMBULATORY NON FORMULARY MEDICATION Medication Name: Nitroglycerin ointment 0.125% use pea sized amount 2-3 times a day for 8 weeks 30 g 1   AMBULATORY NON FORMULARY MEDICATION Medication Name: Nitroglycerin 0.125% use pea sized amount per rectum 3-4 times daily 30 g 0   cetirizine (ZYRTEC) 10 MG chewable tablet Chew 10 mg by mouth daily.     diltiazem 2 % GEL Apply 1 Application topically 2 (two) times daily. With lidocaine      famotidine  (PEPCID ) 20 MG tablet Take 1 tablet (20 mg total) by mouth 2 (two) times daily. For heartburn. 180 tablet 3   fexofenadine (ALLEGRA) 180 MG tablet Take 180 mg by mouth as needed.     fluticasone  (FLONASE ) 50 MCG/ACT nasal spray PLACE 1 SPRAY INTO BOTH NOSTRILS 2 (TWO) TIMES DAILY 16 mL 0   NIKKI 3-0.02 MG tablet Take 1 tablet by mouth daily.     propranolol  ER (INDERAL  LA) 80 MG 24 hr capsule TAKE 1 CAPSULE (80 MG TOTAL) BY  MOUTH DAILY. FOR HEADACHE PREVENTION 30 capsule 0   No current facility-administered medications on file prior to visit.    BP 136/80   Pulse 79   Temp 98.4 F (36.9 C) (Temporal)   Ht 5\' 1"  (1.549 m)   Wt 162 lb (73.5 kg)   LMP 05/28/2023 (Approximate)   SpO2 99%   BMI 30.61 kg/m  Objective:   Physical Exam Constitutional:      Appearance: She is not ill-appearing.  HENT:     Right Ear: Tympanic membrane and ear canal normal. Tympanic membrane is not injected or erythematous.     Left Ear: Ear canal normal. Tympanic membrane is bulging. Tympanic membrane is not injected or erythematous.     Nose: No mucosal edema.     Right Sinus: No maxillary sinus tenderness or frontal sinus tenderness.     Left Sinus: No maxillary sinus tenderness or frontal sinus tenderness.     Mouth/Throat:     Mouth: Mucous membranes are moist.  Eyes:     Conjunctiva/sclera: Conjunctivae normal.  Cardiovascular:     Rate and Rhythm: Normal rate and regular rhythm.  Pulmonary:     Effort: Pulmonary effort is normal.     Breath sounds: Normal breath sounds. No wheezing or rhonchi.  Musculoskeletal:     Cervical back: Neck supple.  Skin:    General: Skin is warm and dry.           Assessment & Plan:  Sore throat Assessment & Plan: Exam today without evidence of strep pharyngitis.  Rapid strep test today negative. Rapid COVID-19 test today negative.  Symptoms are likely allergy versus viral.  Recommended she resume Flonase  and Allegra. Advil  as needed for sore throat.  Return precautions provided.  Orders: -     POC COVID-19 BinaxNow -     POCT rapid strep A        Gabriel John, NP

## 2023-06-17 NOTE — Assessment & Plan Note (Signed)
 Exam today without evidence of strep pharyngitis.  Rapid strep test today negative. Rapid COVID-19 test today negative.  Symptoms are likely allergy versus viral.  Recommended she resume Flonase  and Allegra. Advil  as needed for sore throat.  Return precautions provided.

## 2023-06-17 NOTE — Telephone Encounter (Signed)
 Called  pt and schedule a appt for cpe

## 2023-06-17 NOTE — Patient Instructions (Signed)
 Resume your Allegra allergy pill every day and the Flonase  nasal spray twice daily.  Okay to take Advil  as needed for pain.  Please call us  next Tuesday if your symptoms are worse.  It was a pleasure to see you today!

## 2023-06-18 ENCOUNTER — Telehealth: Payer: Self-pay

## 2023-06-18 NOTE — Telephone Encounter (Signed)
 Copied from CRM [id number]. Topic: Clinical - Medical Advice >> Jun 18, 2023  8:03 AM Jacqueline Skinner wrote: Reason for CRM: Patient called in regarding appointment yesterday, stated when looking at her ears yesterday they were fine, but now she is way worse than she was yesterday, would like for NP Tretha Fu or her nurses to give her a callback on what she should do   [phone]

## 2023-06-18 NOTE — Telephone Encounter (Signed)
 Spoke with patient she states last night she moved her head a certain way and that caused her left ear to be plugged, she's hearing ringing, pain, states she feels like her ear drum was going to rupture.  Patient has used ear pain MD drops and noticed some drainage this morning after she used them. Asking what she should do for the ear pain

## 2023-06-18 NOTE — Telephone Encounter (Signed)
 If something is significantly different from her recent exam then she needs to be reevaluated.  Has she tried the Flonase  nasal spray?  Without reevaluation is difficult to assess regarding her pain as her exam was normal.  Tylenol  and ibuprofen  can be used.  She can alternate between the two.

## 2023-06-19 NOTE — Telephone Encounter (Signed)
 Copied from CRM [id number]. Topic: Clinical - Medical Advice >> Jun 19, 2023  8:30 AM Carlatta H wrote: Reason for CRM: Patient went to [facility] and she has an ear infection in both ears//Patient was prescribed 2 medications//Patient was also advised to see a ENT//Please call patient to advise//

## 2023-06-19 NOTE — Telephone Encounter (Signed)
 Unable to reach patient. Left voicemail to return call to our office.

## 2023-06-19 NOTE — Telephone Encounter (Signed)
 Called and spoke with patient, she was given abx and steroids from the [facility] for ear infection. She is checking with local ENT to determine if she can get a referral and if she would need a f/u with us  or if she can get in to see them.

## 2023-06-20 DIAGNOSIS — Z8669 Personal history of other diseases of the nervous system and sense organs: Secondary | ICD-10-CM

## 2023-06-20 DIAGNOSIS — J3089 Other allergic rhinitis: Secondary | ICD-10-CM

## 2023-06-20 DIAGNOSIS — H9203 Otalgia, bilateral: Secondary | ICD-10-CM

## 2023-06-24 MED ORDER — CETIRIZINE HCL 10 MG PO TABS
10.0000 mg | ORAL_TABLET | Freq: Every day | ORAL | 0 refills | Status: DC
Start: 2023-06-24 — End: 2023-09-19

## 2023-06-28 ENCOUNTER — Other Ambulatory Visit: Payer: Self-pay | Admitting: Primary Care

## 2023-06-28 DIAGNOSIS — K219 Gastro-esophageal reflux disease without esophagitis: Secondary | ICD-10-CM

## 2023-06-30 ENCOUNTER — Encounter (INDEPENDENT_AMBULATORY_CARE_PROVIDER_SITE_OTHER): Payer: Self-pay | Admitting: Physician Assistant

## 2023-06-30 ENCOUNTER — Ambulatory Visit (INDEPENDENT_AMBULATORY_CARE_PROVIDER_SITE_OTHER): Admitting: Physician Assistant

## 2023-06-30 ENCOUNTER — Ambulatory Visit (INDEPENDENT_AMBULATORY_CARE_PROVIDER_SITE_OTHER): Admitting: Audiology

## 2023-06-30 VITALS — BP 135/85 | HR 110 | Ht 61.0 in | Wt 161.0 lb

## 2023-06-30 DIAGNOSIS — H90A32 Mixed conductive and sensorineural hearing loss, unilateral, left ear with restricted hearing on the contralateral side: Secondary | ICD-10-CM

## 2023-06-30 DIAGNOSIS — H9041 Sensorineural hearing loss, unilateral, right ear, with unrestricted hearing on the contralateral side: Secondary | ICD-10-CM | POA: Diagnosis not present

## 2023-06-30 DIAGNOSIS — Z8669 Personal history of other diseases of the nervous system and sense organs: Secondary | ICD-10-CM | POA: Diagnosis not present

## 2023-06-30 DIAGNOSIS — H6690 Otitis media, unspecified, unspecified ear: Secondary | ICD-10-CM

## 2023-06-30 DIAGNOSIS — Z09 Encounter for follow-up examination after completed treatment for conditions other than malignant neoplasm: Secondary | ICD-10-CM | POA: Diagnosis not present

## 2023-06-30 MED ORDER — FLUTICASONE PROPIONATE 50 MCG/ACT NA SUSP: 2.0000 | Freq: Every day | NASAL | 6 refills | Status: AC

## 2023-06-30 NOTE — Progress Notes (Signed)
  [address], [address] [place], [place] [address] [phone]  Audiological Evaluation    Name: Jacqueline Skinner     DOB:   05/08/1980      MRN:   [id number]                                                                                     Service Date: 06/30/2023     Accompanied by: unaccompanied    Patient comes today after Lorane Rocker, PA-C sent a referral for a hearing evaluation due to concerns with recurrent ear infections.   Symptoms Yes Details  Hearing loss  [x]  Clogged up ears sensation  Tinnitus  [x]  Buzzing, high pitched in both ears  Ear pain/ infections/pressure  [x]  OM about 1 week ago . Already finished antibiotic for 10 days. Right feels better, but now left feels worse.   Balance problems  []    Noise exposure history  []    Previous ear surgeries  []    Family history of hearing loss  []    Amplification  []    Other  [x]  Thinks the right eardrum ruptured, patient denied observing fluid coming out from her ears, but felt fluid in her ears.    Otoscopy: Right ear: Essentially clear external ear canal and notable landmarks visualized on the tympanic membrane. Left ear:  Clear external ear canal and notable landmarks visualized on the tympanic membrane.  Tympanometry: Right ear: Type A- Normal external ear canal volume with normal middle ear pressure and tympanic membrane compliance. Left ear: Type A- Normal external ear canal volume with normal middle ear pressure and tympanic membrane compliance.    Pure tone Audiometry: Right ear- Normal hearing from [phone] Hz, then moderate presumably sensorineural hearing loss from 6000 Hz - 8000 Hz. Left ear-  Normal hearing from [phone] Hz , then mild to  moderate presumably mixed hearing loss from 250 Hz - 8000 Hz.  Speech Audiometry: Right ear- Speech Reception Threshold (SRT) was obtained at 10 dBHL. Left ear-Speech Reception Threshold (SRT) was obtained at 10 dBHL.   Word Recognition Score Tested using  NU-6 (recorded) Right ear: 100% was obtained at a presentation level of 60 dBHL with contralateral masking which is deemed as  excellent. Left ear: 100% was obtained at a presentation level of 60 dBHL with contralateral masking which is deemed as  excellent.   The hearing test results were completed under headphones and re-checked with inserts and results are deemed to be of good reliability. Test technique:  conventional      Recommendations: Follow up with ENT as scheduled for today. Return for a hearing evaluation if concerns with hearing changes arise or per MD recommendation.   Wynston Romey MARIE LEROUX-MARTINEZ, AUD

## 2023-06-30 NOTE — Progress Notes (Unsigned)
 Dear Dr. Fulton Job, Here is my assessment for our mutual patient, Jacqueline Skinner. Thank you for allowing me the opportunity to care for your patient. Please do not hesitate to contact me should you have any other questions. Sincerely, Belma Boxer PA-C  Otolaryngology Clinic Note Referring provider: Dr. Fulton Job HPI:  Jacqueline Skinner is a 43 y.o. female kindly referred by Dr. Fulton Job   The patient is a 43 year old female seen in our office for evaluation of otitis media.  The patient notes that approximately 2 weeks ago she was diagnosed with an ear infection on the right.  She had originally gone to her primary care provider with right-sided ear pain but they did not appreciate any significant abnormalities. She  Ear infection two weeks ago-alos had sore throat   Went to PCP didn't see anything, started on left then went to right  Went to urgent care  Diagnsoes with right sided ottitis meida, antiboitc and steorids. Beend taking antiboitc for 10 days  Second time this has happened, not sure what ear this was. Two years ago   Thought tha tmaybe see did have fluid out fo the ear but not sure   Today still having some pain on the left, random sharp pain, left ear feels full, right random pain, not consistent    No problems as a child,   Ringing tonal both ears, no dizziness   Seasonal allergies, taking zyrtec  since this started     Independent Review of Additional Tests or Records:  Audiological evaluation on 06/30/2023.  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   Breast cancer Paternal Grandmother    Diabetes Paternal Grandfather    Autism Son    ADD / ADHD Son    Colon polyps Neg Hx    Colon cancer Neg Hx    Esophageal cancer Neg Hx    Stomach cancer Neg Hx    Rectal cancer Neg Hx      Social Connections: Not on file      Current Outpatient Medications:    AMBULATORY NON FORMULARY MEDICATION, Medication Name: Nitroglycerin ointment 0.125% use pea sized amount 2-3 times a day for 8 weeks, Disp: 30 g, Rfl: 1   AMBULATORY NON FORMULARY MEDICATION, Medication Name: Nitroglycerin 0.125% use pea sized amount per rectum 3-4 times daily, Disp: 30 g, Rfl: 0   cetirizine  (ZYRTEC ) 10 MG tablet, Take 1 tablet (10 mg total) by mouth daily. For allergies, Disp: 90 tablet, Rfl: 0   diltiazem 2 % GEL, Apply 1 Application topically 2 (two) times daily. With lidocaine , Disp: , Rfl:  famotidine  (PEPCID ) 20 MG tablet, TAKE 1 TABLET BY MOUTH TWICE A DAY FOR HEARTBURN, Disp: 180 tablet, Rfl: 0   fexofenadine (ALLEGRA) 180 MG tablet, Take 180 mg by mouth as needed., Disp: , Rfl:    fluticasone  (FLONASE ) 50 MCG/ACT nasal spray, PLACE 1 SPRAY INTO BOTH NOSTRILS 2 (TWO) TIMES DAILY, Disp: 16 mL, Rfl: 0   NIKKI 3-0.02 MG tablet, Take 1 tablet by mouth daily., Disp: , Rfl:    propranolol  ER (INDERAL  LA) 80 MG 24 hr capsule, TAKE 1 CAPSULE (80 MG TOTAL) BY MOUTH DAILY. FOR HEADACHE PREVENTION, Disp: 30 capsule, Rfl: 0   Physical Exam:   BP 135/85   Pulse (!) 110   Ht 5\' 1"  (1.549 m)   Wt 161 lb (73 kg)   LMP 05/28/2023 (Approximate)   SpO2 96%   BMI 30.42 kg/m   Pertinent Findings  CN II-XII  intact Bilateral EAC clear and TM intact with well pneumatized middle ear spaces, right TM with green foreign appearing material along central TM Weber 512: equal Rinne 512: AC > BC b/l  Anterior rhinoscopy: Septum midline; bilateral inferior turbinates with no hypertrophy or edema  No lesions of oral cavity/oropharynx; dentition WNL, absent tonsils  No obviously palpable neck masses/lymphadenopathy/thyromegaly No respiratory distress or stridor   Seprately Identifiable Procedures:  None  Impression & Plans:  [name] is a 43 y.o. female with the following   Otitis media-  42 YOF seen today for evaluation of otitis media. This seems to have resolved without issues. She does have very minimal air bone gap on the right and symmetric sensorineural loss from 2000 hz to 8000. She has had a previous episode of otitis media but does this not seem to be recurring on a regular basis.  At this time no further intervention is warranted, I would like to see her back in the office if she develops any new or worsening signs or symptoms.  She verbalized understanding and agreement to today's plan had no further questions or concerns.     - f/u PRN   Thank you for allowing me the opportunity to care for your patient. Please do not hesitate to contact me should you have any other questions.  Sincerely, Belma Boxer PA-C [facility] Phone: [phone] Fax: [phone]  06/30/2023, 3:18 PM

## 2023-06-30 NOTE — Patient Instructions (Signed)
 To help with your buildup of earwax try using sweet oil. This can be purchased over the counter at your local pharmacy. Using a sterilized ear dropper, place a few drops into your ear. Cover the ear with a cotton ball, or warm compress, for 5 to 10 minutes. Rub gently. Wipe out any excess ear wax, and the oil, with a cotton ball or a wet cloth.

## 2023-07-08 ENCOUNTER — Encounter: Payer: Self-pay | Admitting: Audiology

## 2023-07-18 ENCOUNTER — Ambulatory Visit (INDEPENDENT_AMBULATORY_CARE_PROVIDER_SITE_OTHER): Admitting: Primary Care

## 2023-07-18 ENCOUNTER — Encounter: Payer: Self-pay | Admitting: Primary Care

## 2023-07-18 ENCOUNTER — Ambulatory Visit: Payer: Self-pay | Admitting: Primary Care

## 2023-07-18 VITALS — BP 98/62 | HR 69 | Temp 97.1°F | Ht 61.0 in | Wt 164.0 lb

## 2023-07-18 DIAGNOSIS — K219 Gastro-esophageal reflux disease without esophagitis: Secondary | ICD-10-CM

## 2023-07-18 DIAGNOSIS — O1003 Pre-existing essential hypertension complicating the puerperium: Secondary | ICD-10-CM

## 2023-07-18 DIAGNOSIS — E785 Hyperlipidemia, unspecified: Secondary | ICD-10-CM | POA: Diagnosis not present

## 2023-07-18 DIAGNOSIS — F411 Generalized anxiety disorder: Secondary | ICD-10-CM

## 2023-07-18 DIAGNOSIS — K602 Anal fissure, unspecified: Secondary | ICD-10-CM | POA: Diagnosis not present

## 2023-07-18 DIAGNOSIS — R519 Headache, unspecified: Secondary | ICD-10-CM

## 2023-07-18 DIAGNOSIS — Z0000 Encounter for general adult medical examination without abnormal findings: Secondary | ICD-10-CM | POA: Diagnosis not present

## 2023-07-18 LAB — LIPID PANEL
Cholesterol: 178 mg/dL (ref 0–200)
HDL: 60 mg/dL (ref >39.00)
LDL Cholesterol: 100 mg/dL — ABNORMAL HIGH (ref 0–99)
NonHDL: 118.39
Total CHOL/HDL Ratio: 3
Triglycerides: 91 mg/dL (ref 0.0–149.0)
VLDL: 18.2 mg/dL (ref 0.0–40.0)

## 2023-07-18 NOTE — Assessment & Plan Note (Signed)
 Repeat lipid panel pending.

## 2023-07-18 NOTE — Assessment & Plan Note (Signed)
Controlled. ? ?Continue to monitor off medications.  ?

## 2023-07-18 NOTE — Assessment & Plan Note (Signed)
 Controlled.  Continue propranolol  ER 80 mg HS.

## 2023-07-18 NOTE — Assessment & Plan Note (Signed)
Controlled.  Continue famotidine 20 mg daily PRN.

## 2023-07-18 NOTE — Progress Notes (Signed)
 Subjective:    Patient ID: Jacqueline Skinner, female    DOB: 08/14/1980, 43 y.o.   MRN: [id number]  HPI  Jacqueline Skinner is a very pleasant 43 y.o. female who presents today for complete physical and follow up of chronic conditions.  Immunizations: -Tetanus: Completed in 2018  Diet: Fair diet.  Exercise: No regular exercise.  Eye exam: Completed>1 year ago Dental exam: Completes semi-annually    Pap Smear: Completes with GYN.  Up-to-date per patient. Mammogram: Completes at GYN office.  Up-to-date per patient.  BP Readings from Last 3 Encounters:  07/18/23 98/62  06/30/23 135/85  06/17/23 136/80      Review of Systems  Constitutional:  Negative for unexpected weight change.  HENT:  Positive for tinnitus. Negative for rhinorrhea.   Respiratory:  Negative for cough and shortness of breath.   Cardiovascular:  Negative for chest pain.  Gastrointestinal:  Negative for constipation and diarrhea.  Genitourinary:  Negative for difficulty urinating.  Musculoskeletal:  Negative for arthralgias and myalgias.  Skin:  Negative for rash.  Allergic/Immunologic: Negative for environmental allergies.  Neurological:  Negative for dizziness, numbness and headaches.  Psychiatric/Behavioral:  The patient is not nervous/anxious.          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 ([facility])   Anal fissure    Angular cheilitis 06/02/2020   Anxiety    Ear fullness, left 03/13/2021   Essential hypertension-postpartum    GERD (gastroesophageal reflux disease)    Hemorrhoid    PROM (premature rupture of membranes) 03/07/2017   SVD (spontaneous vaginal delivery) 03/07/2017   Twitching 03/13/2021    Social History   Socioeconomic History   Marital status: Married    Spouse name: Not on file   Number of children: 2   Years of education: Not on file   Highest education level: Not on file   Occupational History   Occupation: transaction coordinator/real estate  Tobacco Use   Smoking status: Former    Current packs/day: 0.00    Types: Cigarettes    Quit date: 2008    Years since quitting: 17.4   Smokeless tobacco: Never  Vaping Use   Vaping status: Never Used  Substance and Sexual Activity   Alcohol use: Yes    Alcohol/week: 21.0 standard drinks of alcohol    Types: 21 Glasses of wine per week    Comment: 3 per day   Drug use: No   Sexual activity: Yes  Other Topics Concern   Not on file  Social History Narrative   Married.   2 children.   Works in [employer] and [place].   Social Drivers of Corporate investment banker Strain: Not on file  Food Insecurity: Not on file  Transportation Needs: Not on file  Physical Activity: Not on file  Stress: Not on file  Social Connections: Not on file  Intimate Partner Violence: Not on file    Past Surgical History:  Procedure Laterality Date   LEEP  2017   abnormal pap x2 with HR HPV, coplo LGSIL, rare cells +HGSIL (Dr Rolan)    Family History  Problem Relation Age of Onset   Dementia Mother    Diabetes Father    Pancreatic cancer Paternal Uncle        ?   Breast cancer Paternal Grandmother    Diabetes Paternal Grandfather    Autism Son  ADD / ADHD Son    Colon polyps Neg Hx    Colon cancer Neg Hx    Esophageal cancer Neg Hx    Stomach cancer Neg Hx    Rectal cancer Neg Hx     No Known Allergies  Current Outpatient Medications on File Prior to Visit  Medication Sig Dispense Refill   AMBULATORY NON FORMULARY MEDICATION Medication Name: Nitroglycerin ointment 0.125% use pea sized amount 2-3 times a day for 8 weeks 30 g 1   AMBULATORY NON FORMULARY MEDICATION Medication Name: Nitroglycerin 0.125% use pea sized amount per rectum 3-4 times daily 30 g 0   cetirizine  (ZYRTEC ) 10 MG tablet Take 1 tablet (10 mg total) by mouth daily. For allergies 90 tablet 0   diltiazem 2 % GEL Apply 1 Application topically 2  (two) times daily. With lidocaine      famotidine  (PEPCID ) 20 MG tablet TAKE 1 TABLET BY MOUTH TWICE A DAY FOR HEARTBURN 180 tablet 0   fluticasone  (FLONASE ) 50 MCG/ACT nasal spray Place 2 sprays into both nostrils daily. 16 g 6   NIKKI 3-0.02 MG tablet Take 1 tablet by mouth daily.     propranolol  ER (INDERAL  LA) 80 MG 24 hr capsule TAKE 1 CAPSULE (80 MG TOTAL) BY MOUTH DAILY. FOR HEADACHE PREVENTION 30 capsule 0   No current facility-administered medications on file prior to visit.    BP 98/62   Pulse 69   Temp (!) 97.1 F (36.2 C) (Temporal)   Ht 5' 1 (1.549 m)   Wt 164 lb (74.4 kg)   LMP 07/02/2023   SpO2 100%   Breastfeeding No   BMI 30.99 kg/m  Objective:   Physical Exam HENT:     Right Ear: Tympanic membrane and ear canal normal.     Left Ear: Tympanic membrane and ear canal normal.   Eyes:     Pupils: Pupils are equal, round, and reactive to light.    Cardiovascular:     Rate and Rhythm: Normal rate and regular rhythm.  Pulmonary:     Effort: Pulmonary effort is normal.     Breath sounds: Normal breath sounds.  Abdominal:     General: Bowel sounds are normal.     Palpations: Abdomen is soft.     Tenderness: There is no abdominal tenderness.   Musculoskeletal:        General: Normal range of motion.     Cervical back: Neck supple.   Skin:    General: Skin is warm and dry.   Neurological:     Mental Status: She is alert and oriented to person, place, and time.     Cranial Nerves: No cranial nerve deficit.     Deep Tendon Reflexes:     Reflex Scores:      Patellar reflexes are 2+ on the right side and 2+ on the left side.  Psychiatric:        Mood and Affect: Mood normal.           Assessment & Plan:  Preventative health care Assessment & Plan: Immunizations UTD. Pap smear UTD. Follows with GYN Mammogram UTD follows with GYN  Discussed the importance of a healthy diet and regular exercise in order for weight loss, and to reduce the risk of  further co-morbidity.  Exam stable. Labs pending and reviewed.  Follow up in 1 year for repeat physical.    Essential hypertension-postpartum Assessment & Plan: Controlled.  Continue to monitor off medications.    Gastroesophageal  reflux disease, unspecified whether esophagitis present Assessment & Plan: Controlled.  Continue famotidine  20 mg daily PRN.   Rectal fissure Assessment & Plan: Following with GI.  Continue diltiazem 2% BID. She will start Benefiber    Frequent headaches Assessment & Plan: Controlled.  Continue propranolol  ER 80 mg HS.    GAD (generalized anxiety disorder) Assessment & Plan: Stable.  Continue to monitor off Zoloft .  She will update if anything changes.   Hyperlipidemia, unspecified hyperlipidemia type Assessment & Plan: Repeat lipid panel pending.  Orders: -     Lipid panel        [place] MARLA Gaskins, NP

## 2023-07-18 NOTE — Assessment & Plan Note (Signed)
 Following with GI.  Continue diltiazem 2% BID. She will start Benefiber

## 2023-07-18 NOTE — Patient Instructions (Signed)
 Stop by the lab prior to leaving today. I will notify you of your results once received.   It was a pleasure to see you today!

## 2023-07-18 NOTE — Assessment & Plan Note (Signed)
 Immunizations UTD. Pap smear UTD. Follows with GYN Mammogram UTD follows with GYN  Discussed the importance of a healthy diet and regular exercise in order for weight loss, and to reduce the risk of further co-morbidity.  Exam stable. Labs pending and reviewed.  Follow up in 1 year for repeat physical.

## 2023-07-18 NOTE — Assessment & Plan Note (Signed)
 Stable.  Continue to monitor off Zoloft .  She will update if anything changes.

## 2023-07-20 ENCOUNTER — Other Ambulatory Visit: Payer: Self-pay | Admitting: Primary Care

## 2023-07-20 DIAGNOSIS — R519 Headache, unspecified: Secondary | ICD-10-CM

## 2023-08-04 ENCOUNTER — Ambulatory Visit: Admitting: Gastroenterology

## 2023-08-08 ENCOUNTER — Ambulatory Visit: Attending: Gastroenterology | Admitting: Physical Therapy

## 2023-08-08 ENCOUNTER — Encounter: Payer: Self-pay | Admitting: Physical Therapy

## 2023-08-08 ENCOUNTER — Other Ambulatory Visit: Payer: Self-pay

## 2023-08-08 DIAGNOSIS — M6281 Muscle weakness (generalized): Secondary | ICD-10-CM | POA: Diagnosis present

## 2023-08-08 DIAGNOSIS — R293 Abnormal posture: Secondary | ICD-10-CM | POA: Insufficient documentation

## 2023-08-08 NOTE — Therapy (Signed)
 OUTPATIENT PHYSICAL THERAPY FEMALE PELVIC EVALUATION   Patient Name: Honest Safranek MRN: [id number] DOB:23-Jun-1980, 43 y.o., female Today's Date: 08/08/2023  END OF SESSION:  PT End of Session - 08/08/23 1036     Visit Number 1    Date for PT Re-Evaluation 02/07/23    Authorization Type bcbs [id number]  no auth req    PT Start Time 0932    PT Stop Time 1016    PT Time Calculation (min) 44 min    Activity Tolerance Patient tolerated treatment well    Behavior During Therapy [facility] for tasks assessed/performed          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 (Bovard)   Anal fissure    Angular cheilitis 06/02/2020   Anxiety    Ear fullness, left 03/13/2021   Essential hypertension-postpartum    GERD (gastroesophageal reflux disease)    Hemorrhoid    PROM (premature rupture of membranes) 03/07/2017   SVD (spontaneous vaginal delivery) 03/07/2017   Twitching 03/13/2021   Past Surgical History:  Procedure Laterality Date   LEEP  2017   abnormal pap x2 with HR HPV, coplo LGSIL, rare cells +HGSIL (Dr Rolan)   Patient Active Problem List   Diagnosis Date Noted   Hyperlipidemia 07/18/2023   Rectal fissure 06/20/2022   GAD (generalized anxiety disorder) 06/20/2022   GERD (gastroesophageal reflux disease) 03/13/2021   Frequent headaches 06/02/2020   Preventative health care 06/02/2020   Seasonal allergies 04/27/2019   Essential hypertension-postpartum 04/22/2017   SACROILIITIS 10/13/2007    PCP: Gretta Comer POUR, NP  REFERRING PROVIDER: Shila Gustav GAILS, MD  REFERRING DIAG: M62.89 (ICD-10-CM) - Pelvic floor dysfunction 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 Does not really have constipation Has had some problems holding in her pee. Since she has had her daughter  and son.  She is using a cream, hard to find time for sitz baths, reports that it is hard to find time to do fiber and sitz baths.  Anal fissures are ripping, she is not  sure how it started.   Patient works and has hard time finding time.     PAIN:  Are you having pain? No   PRECAUTIONS: None  RED FLAGS: None   WEIGHT BEARING RESTRICTIONS: No  FALLS:  Has patient fallen in last 6 months? No  OCCUPATION: real estate agent- desk job  ACTIVITY LEVEL : not real active, chores and dishes, walks the dog down the road, has an autistic son  PLOF: Independent  PATIENT GOALS: reduced leaking  PERTINENT HISTORY:  Anal fissure Sexual abuse: No  BOWEL MOVEMENT: usually poops after drinking coffee every morning  Pain with bowel movement: Yes it depends medium Type of bowel movement:Type (Bristol Stool Scale) 1 or 4, Frequency 2/ day, Strain yes sometimes, and Splinting no Fully empty rectum: No Leakage: No Pads: No Fiber supplement/laxative Yes has been doing Miralax because she has had it for her son  URINATION: Pain with urination: No Fully empty bladder: Nopee runs out when she check her fissure sometimes Stream: Strong Urgency: Yes  Frequency: no Leakage: Urge to void, Walking to the bathroom, Coughing, Sneezing, Laughing, Exercise, and Bending forward Pads: Yes: 1  INTERCOURSE:  Ability to have vaginal penetration Yes  Pain with intercourse: not really, sometimes will bother the fissure DrynessYes maybe  a little Climax: still does some Marinoff Scale: 0/3 Laxative:yes  PREGNANCY: Vaginal  deliveries 2 - 16 and 6  Tearing No Episiotomy Yes  C-section deliveries 0 Currently pregnant No  PROLAPSE: None   OBJECTIVE:  Note: Objective measures were completed at Evaluation unless otherwise noted.    PATIENT SURVEYS:    PFIQ-7: 10  COGNITION: Overall cognitive status: Within functional limits for tasks assessed     SENSATION: Light touch: Appears intact  LUMBAR SPECIAL TESTS:  Straight leg raise test: Positive for twisting in supine for compensation Single leg squat- dips hips and bilateral knee valgus    POSTURE: No Significant postural limitations, rounded shoulders, and forward head   LUMBARAROM/PROM: full  LOWER EXTREMITY ROM: full   LOWER EXTREMITY MMT: bilateral hips 4-/5  PALPATION:   General: 2 finger abdominal DRA   Pelvic Alignment: even                 External Perineal Exam: to be assessed next visit due to lack of time                             Internal Pelvic Floor: to be assessed next visit due to lack of time  Patient confirms identification and approves PT to assess internal pelvic floor and treatment Yes- next visit due to lack of time   PELVIC MMT:   MMT eval  Vaginal   Internal Anal Sphincter   External Anal Sphincter   Puborectalis   Diastasis Recti  2 fingers, at umbilicus and above umbilicus  (Blank rows = not tested)        TONE: to be assessed next visit due to lack of time  PROLAPSE: to be assessed next visit due to lack of time  TODAY'S TREATMENT:  DATE: 08/08/2023  EVAL EVAL Examination completed, findings reviewed, pt educated on POC, HEP, and female pelvic floor anatomy, reasoning with pelvic floor assessment internally with pt consent. Pt motivated to participate in PT and agreeable to attempt recommendations.     PATIENT EDUCATION/ there acts  Education details: Pt was educated  on relevant anatomy, exam findings, HEP, expectations of PT   Person educated: Patient Education method: Explanation, Demonstration, Tactile cues, Verbal cues, and Handouts Education comprehension: verbalized understanding, returned demonstration, verbal cues required, tactile cues required, and needs further education  HOME EXERCISE PROGRAM: Access Code: [email or web address] URL: [email or web address] Date: 08/08/2023 Prepared by: Cori Arville Postlewaite  Patient Education - Abdominal Massage for Constipation - High-Fiber Diet to Support Pelvic Health - Bowel Emptying Techniques - Abdominal Massage for Constipation - Urinary Incontinence - Urinary Urge Control Techniques  ASSESSMENT:  CLINICAL IMPRESSION: Patient is a 43 y.o. F who was seen today for physical therapy evaluation and treatment for incomplete defecation and mixed urinary incontinence. Patient reported that she has had anal fissure for about a year now, has been taking miralax and was recommended sitz baths which have been difficult to do due to being busy with kids and work. She reported that she leaks with urgency and coughing and sneezing and would like to address that . Exam findings are notable for upper chest breathing strategies, abdominal weakness with diastasis rectus abdominis with distortion,  pelvic floor muscles will be assessed next visit due to lack of time today. Patient demonstrates good trunk flexibility, bilateral hip weakness, decreased strength in core. It is difficult for patient to make it to the bathroom at times  due to strong urinary urgency . Discussed findings with patient, recommended starting to work on healthy bowel recommendations and urge suppression strategies today, patient was educated on squatty potty, fiber, abdominal massage  and HEP was initiated. Patient's quality of life has been affected, patient will benefit from physical therapy to address deficits, reduce leaking and improve bowel emptying.     OBJECTIVE IMPAIRMENTS: decreased knowledge of condition, decreased strength, impaired tone, and pain.   ACTIVITY LIMITATIONS: bending, continence, and toileting  PARTICIPATION LIMITATIONS: community activity  PERSONAL FACTORS: Time since onset of injury/illness/exacerbation are also affecting patient's functional outcome.   REHAB POTENTIAL: Good  CLINICAL DECISION MAKING: Evolving/moderate complexity  EVALUATION COMPLEXITY: Moderate   GOALS: Goals reviewed with patient? Yes  SHORT TERM GOALS: Target date: 09/05/2023    Patient will be I with abdominal massage Baseline: Goal status: INITIAL  2.  Patient will teach back urge suppression strategies Baseline:  Goal status: INITIAL  3.   Pt will be I and consistent with her HEP and demonstrate all exercises correctly  Baseline:  Goal status: INITIAL  4.  Pt will be educated on vaginal moisturizers to improve vaginal tissue health and improve lengthening of the tissue for continence and defecation.  Baseline:  Goal status: INITIAL    LONG TERM GOALS: Target date: 02/07/2024  Patient will report complete rectum emptying 100% of time Baseline:  Goal status: INITIAL  2.  Pt will soak 0 pads/ day in order to run errands and not have to interrupt daily tasks.  Baseline: 1-2 Goal status: INITIAL  3.  Patient will not leak urine with sneezing and laughing Baseline:  Goal status: INITIAL  4.  Patient will demonstrate good pelvic floor PROM/ AROM and be able to contract/ relax and bulge Baseline:  Goal status: INITIAL  5.  Pt will demonstrate 10 point improvement in PFIQ-7  score in order to show functional improvement in urinary incontinence.   Baseline: 10 Goal status: INITIAL    PLAN:  PT FREQUENCY: 1-2x/week  PT DURATION: 6 months  PLANNED INTERVENTIONS: 97110-Therapeutic exercises, 97530- Therapeutic activity, 97112- Neuromuscular re-education, 97535- Self Care, 02859- Manual therapy, [phone]- Electrical  stimulation (manual), [phone] (1-2 muscles), [phone] (3+ muscles)- Dry Needling, Patient/Family education, Taping, Joint mobilization, Joint manipulation, Spinal manipulation, Spinal mobilization, Scar mobilization, Cryotherapy, Moist heat, and Biofeedback  PLAN FOR NEXT SESSION: internal pelvic floor muscle assessment, start exercises, review urge suppression techniques, bladder diary, introduce TTNS, pelvic floor exercises   Robyn Nohr, PT 08/08/2023, 10:37 AM

## 2023-08-11 ENCOUNTER — Encounter: Payer: Self-pay | Admitting: Physical Therapy

## 2023-08-11 ENCOUNTER — Ambulatory Visit: Admitting: Physical Therapy

## 2023-08-11 DIAGNOSIS — M6281 Muscle weakness (generalized): Secondary | ICD-10-CM

## 2023-08-11 DIAGNOSIS — R293 Abnormal posture: Secondary | ICD-10-CM

## 2023-08-11 NOTE — Therapy (Signed)
 OUTPATIENT PHYSICAL THERAPY FEMALE PELVIC EVALUATION   Patient Name: Jacqueline Skinner MRN: [id number] DOB:[birth year], 43 y.o., female Today's Date: 08/11/2023  END OF SESSION:  PT End of Session - 08/11/23 1549     Visit Number 2    Date for PT Re-Evaluation 02/07/23    Authorization Type bcbs [id number]  no auth req    PT Start Time 1535    PT Stop Time 1615    PT Time Calculation (min) 40 min    Activity Tolerance Patient tolerated treatment well    Behavior During Therapy [facility] for tasks assessed/performed           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 ([facility]ard)   Anal fissure    Angular cheilitis 06/02/2020   Anxiety    Ear fullness, left 03/13/2021   Essential hypertension-postpartum    GERD (gastroesophageal reflux disease)    Hemorrhoid    PROM (premature rupture of membranes) 03/07/2017   SVD (spontaneous vaginal delivery) 03/07/2017   Twitching 03/13/2021   Past Surgical History:  Procedure Laterality Date   LEEP  2017   abnormal pap x2 with HR HPV, coplo LGSIL, rare cells +HGSIL (Dr Rolan)   Patient Active Problem List   Diagnosis Date Noted   Hyperlipidemia 07/18/2023   Rectal fissure 06/20/2022   GAD (generalized anxiety disorder) 06/20/2022   GERD (gastroesophageal reflux disease) 03/13/2021   Frequent headaches 06/02/2020   Preventative health care 06/02/2020   Seasonal allergies 04/27/2019   Essential hypertension-postpartum 04/22/2017   SACROILIITIS 10/13/2007    PCP: Gretta Comer POUR, NP  REFERRING PROVIDER: Shila Gustav GAILS, MD  REFERRING DIAG: M62.89 (ICD-10-CM) - Pelvic floor dysfunction 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 Has not been taking anything over the weekend, working on more fiber. Ate some chickpeas.  Patient reports that she checked and where she had episiotomy that is where the fissure is.    PAIN:  Are you having pain? No   PRECAUTIONS: None  RED FLAGS: None   WEIGHT BEARING RESTRICTIONS: No  FALLS:  Has  patient fallen in last 6 months? No  OCCUPATION: real estate agent- desk job  ACTIVITY LEVEL : not real active, chores and dishes, walks the dog down the road, has an autistic son  PLOF: Independent  PATIENT GOALS: reduced urine leaking  PERTINENT HISTORY:  Anal fissure Sexual abuse: No  BOWEL MOVEMENT: usually poops after drinking coffee every morning  Pain with bowel movement: Yes it depends medium Type of bowel movement:Type (Bristol Stool Scale) 1 or 4, Frequency 2/ day, Strain yes sometimes, and Splinting no Fully empty rectum: No Leakage: No Pads: No Fiber supplement/laxative Yes has been doing Miralax because she has had it for her son  URINATION: Pain with urination: No Fully empty bladder: Nopee runs out when she check her fissure sometimes Stream: Strong Urgency: Yes  Frequency: no Leakage: Urge to void, Walking to the bathroom, Coughing, Sneezing, Laughing, Exercise, and Bending forward Pads: Yes: 1  INTERCOURSE:  Ability to have vaginal penetration Yes  Pain with intercourse: not really, sometimes will bother the fissure DrynessYes maybe  a little Climax: still does some Marinoff Scale: 0/3 Laxative:yes  PREGNANCY: Vaginal deliveries 2 - 16 and 6  Tearing No Episiotomy Yes  C-section deliveries 0 Currently pregnant No  PROLAPSE: None   OBJECTIVE:  Note: Objective measures were completed at  Evaluation unless otherwise noted.    PATIENT SURVEYS:    PFIQ-7: 10  COGNITION: Overall cognitive status: Within functional limits for tasks assessed     SENSATION: Light touch: Appears intact  LUMBAR SPECIAL TESTS:  Straight leg raise test: Positive for twisting in supine for compensation Single leg squat- dips hips and bilateral knee valgus    POSTURE: No Significant postural limitations, rounded shoulders, and forward head   LUMBARAROM/PROM: full  LOWER EXTREMITY ROM: full   LOWER EXTREMITY MMT: bilateral hips 4-/5  PALPATION:   General: 2 finger abdominal DRA   Pelvic Alignment: even                 External Perineal Exam: signs of decreased estrogen- labial phimosis present                              Internal Pelvic Floor: weakness vaginally and tightness and high tone rectally, patient tends to bulge when asked to contract  Patient confirms identification and approves PT to assess internal pelvic floor and treatment Yes Patient confirms identification and approves PT to assess internal pelvic floor and treatment Yes No emotional/communication barriers or cognitive limitation. Patient is motivated to learn. Patient understands and agrees with treatment goals and plan. PT explains patient will be examined in standing, sitting, and lying down to see how their muscles and joints work. When they are ready, they will be asked to remove their underwear so PT can examine their perineum. The patient is also given the option of providing their own chaperone as one is not provided in our facility. The patient also has the right and is explained the right to defer or refuse any part of the evaluation or treatment including the internal exam. With the patient's consent, PT will use one gloved finger to gently assess the muscles of the pelvic floor, seeing how well it contracts and relaxes and if there is muscle symmetry. After, the patient will get dressed and PT and patient  will discuss exam findings and plan of care. PT and patient discuss plan of care, schedule, attendance policy and HEP activities.  PELVIC MMT:   MMT eval  Vaginal 0/5  Internal Anal Sphincter 4/5  External Anal Sphincter 4/5  Puborectalis   Diastasis Recti  2 fingers, at umbilicus and above umbilicus  (Blank rows = not tested)        TONE: High rectally, low vaginally  PROLAPSE: Mild anterior and posterior vaginal wall laxity, mild bladder prolapse with cough in hooklying   TODAY'S TREATMENT:                                                                                                                              DATE:  08/11/2023 Neuroreed- internal pelvic floor assessment with multimodal clues for contraction, relaxation and lengthening There acts- eval completion, education on fiber, healthy bowel PT recommendations, sitz baths, HEP Hip add with resistance with transverse abdominis breath      08/08/2023  EVAL EVAL Examination completed, findings reviewed, pt educated on POC, HEP, and female pelvic floor anatomy, reasoning with pelvic floor assessment internally with pt consent. Pt motivated to participate in PT and agreeable to attempt recommendations.     PATIENT EDUCATION/ there acts  Education details: Pt was educated on relevant anatomy, exam findings, HEP, expectations of PT   Person educated: Patient Education method: Explanation, Demonstration, Tactile cues, Verbal cues, and Handouts Education comprehension: verbalized understanding, returned demonstration, verbal cues required, tactile cues required, and needs further education  HOME EXERCISE PROGRAM: Access Code: [email or web address] URL: [email or web address] Date: 08/08/2023 Prepared by: Cori Mckensey Berghuis  Patient Education - Abdominal Massage for Constipation - High-Fiber Diet to Support Pelvic Health - Bowel Emptying Techniques - Abdominal Massage for Constipation - Urinary Incontinence - Urinary  Urge Control Techniques  ASSESSMENT:  CLINICAL IMPRESSION: Patient was seen today for treatment of anal fissure and stress urinary incontinence. Patient with high tone rectally and low tone and weakness vaginally. Reported pain with palpation around 4/10 rectally. Patient did fairly well with initial exercises, for pelvic floor weakness. We discussed progress, fiber intake and recommended consistency with HEP, some vaginal estrogen and sitz baths. Treatment session focused on finishing eval and internal pelvic floor assessment. Patient had some difficulty with coordination of pelvic floor with breathing. Patient  will benefit from continued PT to address deficits, reduce pain with bowel movements, stress urinary incontinence and improve quality of life.       Patient is a 43 y.o. F who was seen today for physical therapy evaluation and treatment for incomplete defecation and mixed urinary incontinence. Patient reported that she has had anal fissure for about a year now, has been taking miralax and was recommended sitz baths which have been difficult to do due to being busy with kids and work. She reported that she leaks with urgency and coughing and sneezing and would like to address that . Exam findings are notable for upper chest breathing strategies, abdominal weakness with diastasis rectus abdominis with distortion,  pelvic floor muscles will be assessed next visit due to lack of  time today. Patient demonstrates good trunk flexibility, bilateral hip weakness, decreased strength in core. It is difficult for patient to make it to the bathroom at times  due to strong urinary urgency . Discussed findings with patient, recommended starting to work on healthy bowel recommendations and urge suppression strategies today, patient was educated on squatty potty, fiber, abdominal massage  and HEP was initiated. Patient's quality of life has been affected, patient will benefit from physical therapy to address  deficits, reduce leaking and improve bowel emptying.    OBJECTIVE IMPAIRMENTS: decreased knowledge of condition, decreased strength, impaired tone, and pain.   ACTIVITY LIMITATIONS: bending, continence, and toileting  PARTICIPATION LIMITATIONS: community activity  PERSONAL FACTORS: Time since onset of injury/illness/exacerbation are also affecting patient's functional outcome.   REHAB POTENTIAL: Good  CLINICAL DECISION MAKING: Evolving/moderate complexity  EVALUATION COMPLEXITY: Moderate   GOALS: Goals reviewed with patient? Yes  SHORT TERM GOALS: Target date: 09/05/2023    Patient will be I with abdominal massage Baseline: Goal status: INITIAL  2.  Patient will teach back urge suppression strategies Baseline:  Goal status: INITIAL  3.   Pt will be I and consistent with her HEP and demonstrate all exercises correctly  Baseline:  Goal status: INITIAL  4.  Pt will be educated on vaginal moisturizers to improve vaginal tissue health and improve lengthening of the tissue for continence and defecation.  Baseline:  Goal status: INITIAL    LONG TERM GOALS: Target date: 02/07/2024  Patient will report complete rectum emptying 100% of time Baseline:  Goal status: INITIAL  2.  Pt will soak 0 pads/ day in order to run errands and not have to interrupt daily tasks.  Baseline: 1-2 Goal status: INITIAL  3.  Patient will not leak urine with sneezing and laughing Baseline:  Goal status: INITIAL  4.  Patient will demonstrate good pelvic floor PROM/ AROM and be able to contract/ relax and bulge Baseline:  Goal status: INITIAL  5.  Pt will demonstrate 10 point improvement in PFIQ-7 score in order to show functional improvement in urinary incontinence.   Baseline: 10 Goal status: INITIAL    PLAN:  PT FREQUENCY: 1-2x/week  PT DURATION: 6 months  PLANNED INTERVENTIONS: 97110-Therapeutic exercises, 97530- Therapeutic activity, 97112- Neuromuscular re-education, 97535-  Self Care, 02859- Manual therapy, [phone]- Electrical stimulation (manual), [phone] (1-2 muscles), 20561 (3+ muscles)- Dry Needling, Patient/Family education, Taping, Joint mobilization, Joint manipulation, Spinal manipulation, Spinal mobilization, Scar mobilization, Cryotherapy, Moist heat, and Biofeedback  PLAN FOR NEXT SESSION: internal pelvic floor muscle assessment, start exercises, review urge suppression techniques, bladder diary, introduce TTNS, pelvic floor exercises   Aydon Swamy, PT 08/11/2023, 4:06 PM

## 2023-09-19 ENCOUNTER — Other Ambulatory Visit: Payer: Self-pay | Admitting: Primary Care

## 2023-09-19 ENCOUNTER — Ambulatory Visit: Admitting: Physical Therapy

## 2023-09-19 DIAGNOSIS — J3089 Other allergic rhinitis: Secondary | ICD-10-CM

## 2023-09-24 ENCOUNTER — Other Ambulatory Visit: Payer: Self-pay | Admitting: Primary Care

## 2023-09-24 DIAGNOSIS — K219 Gastro-esophageal reflux disease without esophagitis: Secondary | ICD-10-CM

## 2023-10-06 ENCOUNTER — Encounter: Admitting: Physical Therapy

## 2023-10-13 ENCOUNTER — Ambulatory Visit: Attending: Gastroenterology | Admitting: Physical Therapy

## 2023-10-13 ENCOUNTER — Encounter: Payer: Self-pay | Admitting: Physical Therapy

## 2023-10-13 DIAGNOSIS — M6281 Muscle weakness (generalized): Secondary | ICD-10-CM | POA: Insufficient documentation

## 2023-10-13 DIAGNOSIS — R293 Abnormal posture: Secondary | ICD-10-CM | POA: Insufficient documentation

## 2023-10-13 NOTE — Therapy (Signed)
 OUTPATIENT PHYSICAL THERAPY FEMALE PELVIC treatment   Patient Name: Jacqueline Skinner MRN: [id number] DOB:10-01-1980, 43 y.o., female Today's Date: 10/13/2023  END OF SESSION:  PT End of Session - 10/13/23 1058     Visit Number 3    Date for Recertification  02/07/23    Authorization Type bcbs [id number]  no auth req    PT Start Time 1102    PT Stop Time 1142    PT Time Calculation (min) 40 min    Activity Tolerance Patient tolerated treatment well    Behavior During Therapy [facility] for tasks assessed/performed           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 (Bovard)   Anal fissure    Angular cheilitis 06/02/2020   Anxiety    Ear fullness, left 03/13/2021   Essential hypertension-postpartum    GERD (gastroesophageal reflux disease)    Hemorrhoid    PROM (premature rupture of membranes) 03/07/2017   SVD (spontaneous vaginal delivery) 03/07/2017   Twitching 03/13/2021   Past Surgical History:  Procedure Laterality Date   LEEP  2017   abnormal pap x2 with HR HPV, coplo LGSIL, rare cells +HGSIL (Dr Rolan)   Patient Active Problem List   Diagnosis Date Noted   Hyperlipidemia 07/18/2023   Rectal fissure 06/20/2022   GAD (generalized anxiety disorder) 06/20/2022   GERD (gastroesophageal reflux disease) 03/13/2021   Frequent headaches 06/02/2020   Preventative health care 06/02/2020   Seasonal allergies 04/27/2019   Essential hypertension-postpartum 04/22/2017   SACROILIITIS 10/13/2007    PCP: Gretta Comer POUR, NP  REFERRING PROVIDER: Shila Gustav GAILS, MD  REFERRING DIAG: M62.89 (ICD-10-CM) - Pelvic floor dysfunction 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 Her son had a surgery last month She still has fissure, it is a little sore, about the same Not really constipated too much Not able to do too much exercise Has not had experienced any leaking, improved a little  Reports that she has a lot of mid back pain and some anterior rib pain     Last session Patient reports that her poop does not want to  come all the way out at times because it hurts. Has not been taking anything over the weekend, working on more fiber. Ate some chickpeas.  Patient reports that she checked and where she had episiotomy that is where the fissure is.    PAIN:  Are you having pain? No   PRECAUTIONS: None  RED FLAGS: None   WEIGHT BEARING RESTRICTIONS: No  FALLS:  Has patient fallen in last 6 months? No  OCCUPATION: real estate agent- desk job  ACTIVITY LEVEL : not real active, chores and dishes, walks the dog down the road, has an autistic son  PLOF: Independent  PATIENT GOALS: reduced urine leaking  PERTINENT HISTORY:  Anal fissure Sexual abuse: No  BOWEL MOVEMENT: usually poops after drinking coffee every morning  Pain with bowel movement: Yes it depends medium Type of bowel movement:Type (Bristol Stool Scale) 1 or 4, Frequency 2/ day, Strain yes sometimes, and Splinting no Fully empty rectum: No Leakage: No Pads: No Fiber supplement/laxative Yes has been doing Miralax because she has had it for her son  URINATION: Pain with urination: No Fully empty bladder: Nopee runs out when she check her fissure sometimes Stream: Strong Urgency: Yes  Frequency: no Leakage: Urge to void, Walking to the bathroom, Coughing, Sneezing, Laughing, Exercise, and Bending forward Pads: Yes: 1  INTERCOURSE:  Ability to have vaginal penetration Yes  Pain with  intercourse: not really, sometimes will bother the fissure DrynessYes maybe  a little [place]ax: still does some Marinoff Scale: 0/3 Laxative:yes  PREGNANCY: Vaginal deliveries 2 - 16 and 6  Tearing No Episiotomy Yes  C-section deliveries 0 Currently pregnant No  PROLAPSE: None   OBJECTIVE:  Note: Objective measures were completed at Evaluation unless otherwise noted.    PATIENT SURVEYS:    PFIQ-7: 10  COGNITION: Overall cognitive status: Within functional limits for tasks assessed     SENSATION: Light touch: Appears intact  LUMBAR SPECIAL TESTS:  Straight leg raise test: Positive for twisting in supine for compensation Single leg squat- dips hips and bilateral knee valgus    POSTURE: No Significant postural limitations, rounded shoulders, and forward head   LUMBARAROM/PROM: full  LOWER EXTREMITY ROM: full   LOWER EXTREMITY MMT: bilateral hips 4-/5  PALPATION:   General: 2 finger abdominal DRA   Pelvic Alignment: even                 External Perineal Exam: signs of decreased estrogen- labial phimosis present                              Internal Pelvic Floor: weakness vaginally and tightness and high tone rectally, patient tends to bulge when asked to contract  Patient confirms identification and approves PT to assess internal pelvic floor and treatment Yes Patient confirms identification and approves PT to assess internal pelvic floor and treatment Yes No emotional/communication barriers or cognitive limitation. Patient is motivated to learn. Patient understands and agrees with treatment goals and plan. PT explains patient will be examined in standing, sitting, and lying down to see how their muscles and joints work. When they are ready, they will be asked to remove their underwear so PT can examine their perineum. The patient is also given the option of providing their own chaperone as one is not provided in our facility. The patient also has the right and  is explained the right to defer or refuse any part of the evaluation or  treatment including the internal exam. With the patient's consent, PT will use one gloved finger to gently assess the muscles of the pelvic floor, seeing how well it contracts and relaxes and if there is muscle symmetry. After, the patient will get dressed and PT and patient will discuss exam findings and plan of care. PT and patient discuss plan of care, schedule, attendance policy and HEP activities.     PELVIC MMT:   MMT eval  Vaginal 0/5  Internal Anal Sphincter 4/5  External Anal Sphincter 4/5  Puborectalis   Diastasis Recti  2 fingers, at umbilicus and above umbilicus  (Blank rows = not tested)        TONE: High rectally, low vaginally  PROLAPSE: Mild anterior and posterior vaginal wall laxity, mild bladder prolapse with cough in hooklying   TODAY'S TREATMENT:                                                                                                                              DATE:  10/13/2023 Pectoral Corner stretch 30 secs Foam roller routine with VC's to breathe deep 12 reps each Foam roller child's pose stretch 10 reps Open books with foam roller bilateral 10 reps with diaphragmatic breathing  Education on deep glute massage with foam roller       08/11/2023 Neuroreed- internal pelvic floor assessment with multimodal clues for contraction, relaxation and lengthening There acts- eval completion, education on fiber, healthy bowel PT recommendations, sitz baths, HEP Hip add with resistance with transverse abdominis breath      08/08/2023  EVAL EVAL Examination completed, findings reviewed, pt educated on POC, HEP, and female pelvic floor anatomy, reasoning with pelvic floor assessment internally with pt consent. Pt motivated to participate in PT and agreeable to attempt recommendations.     PATIENT EDUCATION/ there acts  Education details: Pt was educated on relevant anatomy, exam  findings, HEP, expectations of PT   Person educated: Patient Education method: Explanation, Demonstration, Tactile cues, Verbal cues, and Handouts Education comprehension: verbalized understanding, returned demonstration, verbal cues required, tactile cues required, and needs further education  HOME EXERCISE PROGRAM: Access Code: [email or web address] URL: [email or web address] Date: 08/08/2023 Prepared by: Cori Giavonna Pflum  Patient Education - Abdominal Massage for Constipation - High-Fiber Diet to Support Pelvic Health - Bowel Emptying Techniques - Abdominal Massage for Constipation - Urinary Incontinence - Urinary Urge Control Techniques  ASSESSMENT:  CLINICAL IMPRESSION: Patient was seen today for treatment of SUI. Patient with moderated mid back and rib pain and wants some guidance on it- has been to the hospital before thinking she has a heart attack. Patient did well  with exercises, manual therapy and education today. We discussed progress, HEP and recommended consistency. Treatment session focused on exercises to improve mid back and rib pain. Patient had no difficulty with them. Patient is progressing slowly towards goals and will benefit from continued PT to address deficits, reduce leaking and improve quality of life.  Last session Patient was seen today for treatment of anal fissure and stress urinary incontinence. Patient with high tone rectally and low tone and weakness vaginally. Reported pain with palpation around 4/10 rectally. Patient did fairly well with initial exercises, for pelvic floor weakness. We discussed progress, fiber intake and recommended consistency with HEP, some vaginal estrogen and sitz baths. Treatment session focused on finishing eval and internal pelvic floor assessment. Patient had some difficulty with coordination of pelvic floor with breathing. Patient  will benefit from continued PT to address deficits, reduce pain with bowel movements,  stress urinary incontinence and improve quality of life.      eval Patient is a 43 y.o. F who was seen today for physical therapy evaluation and treatment for incomplete defecation and mixed urinary incontinence. Patient reported that she has had anal fissure for about a year now, has been taking miralax and was recommended sitz baths which have been difficult to do due to being busy with kids and work. She reported that she leaks with urgency and coughing and sneezing and would like to address that . Exam findings are notable for upper chest breathing strategies, abdominal weakness with diastasis rectus abdominis with distortion,  pelvic floor muscles will be assessed next visit due to lack of time today. Patient demonstrates good trunk flexibility, bilateral hip weakness, decreased strength in core. It is difficult for patient to make it to the bathroom at times  due to strong urinary urgency . Discussed findings with patient, recommended starting to work on healthy bowel recommendations and urge suppression strategies today, patient was educated on squatty potty, fiber, abdominal massage  and HEP was initiated. Patient's quality of life has been affected, patient will benefit from physical therapy to address deficits, reduce leaking and improve bowel emptying.    OBJECTIVE IMPAIRMENTS: decreased knowledge of condition, decreased strength, impaired tone, and pain.   ACTIVITY LIMITATIONS: bending, continence, and toileting  PARTICIPATION LIMITATIONS: community activity  PERSONAL FACTORS: Time since onset of injury/illness/exacerbation are also affecting patient's functional outcome.   REHAB POTENTIAL: Good  CLINICAL DECISION MAKING: Evolving/moderate complexity  EVALUATION COMPLEXITY: Moderate   GOALS: Goals reviewed with patient? Yes  SHORT TERM GOALS: Target date: 09/05/2023    Patient will be I with abdominal massage Baseline: Goal status: INITIAL  2.  Patient will teach back urge  suppression strategies Baseline:  Goal status: INITIAL  3.   Pt will be I and consistent with her HEP and demonstrate all exercises correctly  Baseline:  Goal status: INITIAL  4.  Pt will be educated on vaginal moisturizers to improve vaginal tissue health and improve lengthening of the tissue for continence and defecation.  Baseline:  Goal status: INITIAL    LONG TERM GOALS: Target date: 02/07/2024  Patient will report complete rectum emptying 100% of time Baseline:  Goal status: INITIAL  2.  Pt will soak 0 pads/ day in order to run errands and not have to interrupt daily tasks.  Baseline: 1-2 Goal status: INITIAL  3.  Patient will not leak urine with sneezing and laughing Baseline:  Goal status: INITIAL  4.  Patient will demonstrate good pelvic floor PROM/ AROM and be able to contract/ relax and bulge Baseline:  Goal status: INITIAL  5.  Pt will demonstrate 10 point improvement in PFIQ-7 score in order to show functional improvement in urinary incontinence.   Baseline: 10 Goal status: INITIAL    PLAN:  PT FREQUENCY: 1-2x/week  PT DURATION: 6 months  PLANNED INTERVENTIONS: 97110-Therapeutic  exercises, 97530- Therapeutic activity, [id number]- Neuromuscular re-education, [phone]- Self Care, [id number]- Manual therapy, [phone]- Electrical stimulation (manual), [phone] (1-2 muscles), [id number] (3+ muscles)- Dry Needling, Patient/Family education, Taping, Joint mobilization, Joint manipulation, Spinal manipulation, Spinal mobilization, Scar mobilization, Cryotherapy, Moist heat, and Biofeedback  PLAN FOR NEXT SESSION: internal pelvic floor muscle assessment, start exercises, review urge suppression techniques, bladder diary, introduce TTNS, pelvic floor exercises   Thurmond Hildebran, PT 10/13/2023, 11:04 AM

## 2023-10-20 ENCOUNTER — Encounter: Payer: Self-pay | Admitting: Physical Therapy

## 2023-10-20 ENCOUNTER — Ambulatory Visit: Admitting: Physical Therapy

## 2023-10-20 DIAGNOSIS — M6281 Muscle weakness (generalized): Secondary | ICD-10-CM

## 2023-10-20 DIAGNOSIS — R293 Abnormal posture: Secondary | ICD-10-CM

## 2023-10-20 NOTE — Therapy (Signed)
 OUTPATIENT PHYSICAL THERAPY FEMALE PELVIC treatment   Patient Name: Jacqueline Skinner MRN: [id number] DOB:[birth year], 43 y.o., female Today's Date: 10/20/2023  END OF SESSION:  PT End of Session - 10/20/23 1059     Visit Number 4    Date for Recertification  02/07/23    Authorization Type bcbs [id number]  no auth req    PT Start Time 1100    PT Stop Time 1140    PT Time Calculation (min) 40 min    Activity Tolerance Patient tolerated treatment well    Behavior During Therapy [facility] for tasks assessed/performed           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 (Bovard)   Anal fissure    Angular cheilitis 06/02/2020   Anxiety    Ear fullness, left 03/13/2021   Essential hypertension-postpartum    GERD (gastroesophageal reflux disease)    Hemorrhoid    PROM (premature rupture of membranes) 03/07/2017   SVD (spontaneous vaginal delivery) 03/07/2017   Twitching 03/13/2021   Past Surgical History:  Procedure Laterality Date   LEEP  2017   abnormal pap x2 with HR HPV, coplo LGSIL, rare cells +HGSIL (Dr Rolan)   Patient Active Problem List   Diagnosis Date Noted   Hyperlipidemia 07/18/2023   Rectal fissure 06/20/2022   GAD (generalized anxiety disorder) 06/20/2022   GERD (gastroesophageal reflux disease) 03/13/2021   Frequent headaches 06/02/2020   Preventative health care 06/02/2020   Seasonal allergies 04/27/2019   Essential hypertension-postpartum 04/22/2017   SACROILIITIS 10/13/2007    PCP: Gretta Comer POUR, NP  REFERRING PROVIDER: Shila Gustav GAILS, MD  REFERRING DIAG: M62.89 (ICD-10-CM) - Pelvic floor dysfunction 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  Constipation too bad.  Back is feeling pretty good. Ribs feel pretty good.  Self mobilizes her ribs       Patient coming back to PT after 2 months. Her son had a surgery last month She still has fissure, it is a little sore, about the same Not really constipated too much Not able to do too much exercise Has  not had experienced any leaking, improved a little  Reports that she has a lot of mid back pain and some anterior rib pain     Last session Patient reports that her poop does not want to come all the way out at times because it hurts. Has not been taking anything over the weekend, working on more fiber. Ate some chickpeas.  Patient reports that she checked and where she had episiotomy that is where the fissure is.    PAIN:  Are you having pain? No   PRECAUTIONS: None  RED FLAGS: None   WEIGHT BEARING RESTRICTIONS: No  FALLS:  Has patient fallen in last 6 months? No  OCCUPATION: real estate agent- desk job  ACTIVITY LEVEL : not real active, chores and dishes, walks the dog down the road, has an autistic son  PLOF: Independent  PATIENT GOALS: reduced urine leaking  PERTINENT HISTORY:  Anal fissure Sexual abuse: No  BOWEL MOVEMENT: usually poops after drinking coffee every morning  Pain with bowel movement: Yes it depends medium Type of bowel movement:Type (Bristol Stool Scale) 1 or 4, Frequency 2/ day, Strain yes sometimes, and Splinting no Fully empty rectum: No Leakage: No Pads: No Fiber supplement/laxative Yes has been doing Miralax because she has had it for her son  URINATION: Pain with urination: No Fully empty bladder: Nopee runs out when she check her fissure sometimes Stream: Strong Urgency: Yes  Frequency:  no Leakage: Urge to void, Walking to the bathroom, Coughing, Sneezing, Laughing, Exercise, and Bending forward Pads: Yes: 1  INTERCOURSE:  Ability to have vaginal penetration Yes  Pain with intercourse: not really, sometimes will bother the fissure DrynessYes maybe  a little Climax: still does some Marinoff Scale: 0/3 Laxative:yes  PREGNANCY: Vaginal deliveries 2 - 16 and 6  Tearing No Episiotomy Yes  C-section deliveries 0 Currently pregnant No  PROLAPSE: None   OBJECTIVE:  Note: Objective measures were completed at Evaluation unless otherwise noted.    PATIENT SURVEYS:    PFIQ-7: 10  COGNITION: Overall cognitive status: Within functional limits for tasks assessed     SENSATION: Light touch: Appears intact  LUMBAR SPECIAL TESTS:  Straight leg raise test: Positive for twisting in supine for compensation Single leg squat- dips hips and bilateral knee valgus    POSTURE: No Significant postural limitations, rounded shoulders, and forward head   LUMBARAROM/PROM: full  LOWER EXTREMITY ROM: full   LOWER EXTREMITY MMT: bilateral hips 4-/5  PALPATION:   General: 2 finger abdominal DRA   Pelvic Alignment: even                 External Perineal Exam: signs of decreased estrogen- labial phimosis present                              Internal Pelvic Floor: weakness vaginally and tightness and high tone rectally, patient tends to bulge when asked to contract  Patient confirms identification and approves PT to assess internal pelvic floor and treatment Yes Patient confirms identification and approves PT to assess internal pelvic floor and treatment Yes No emotional/communication barriers or cognitive limitation. Patient is motivated to learn. Patient understands and agrees with treatment goals and plan. PT explains patient will be examined in standing, sitting, and lying down to see how their muscles and joints work. When they are ready, they will be asked to remove  their underwear so PT can examine their perineum. The patient  is also given the option of providing their own chaperone as one is not provided in our facility. The patient also has the right and is explained the right to defer or refuse any part of the evaluation or treatment including the internal exam. With the patient's consent, PT will use one gloved finger to gently assess the muscles of the pelvic floor, seeing how well it contracts and relaxes and if there is muscle symmetry. After, the patient will get dressed and PT and patient will discuss exam findings and plan of care. PT and patient discuss plan of care, schedule, attendance policy and HEP activities.     PELVIC MMT:   MMT eval  Vaginal 0/5  Internal Anal Sphincter 4/5  External Anal Sphincter 4/5  Puborectalis   Diastasis Recti  2 fingers, at umbilicus and above umbilicus  (Blank rows = not tested)        TONE: High rectally, low vaginally  PROLAPSE: Mild anterior and posterior vaginal wall laxity, mild bladder prolapse with cough in hooklying   TODAY'S TREATMENT:                                                                                                                              DATE:  10/20/2023 Supine ball press with transverse abdominis breath 15 breaths  Supine horizontal abduction with thera band with transverse abdominis breath with hip adduction with ball 15 reps Quadruped with ball resistance with transverse abdominis breath 15 reps #10 kb pick up off floor transverse abdominis breath 15 reps Chin tuck      10/13/2023 Pectoral Corner stretch 30 secs Foam roller routine with VC's to breathe deep 12 reps each Foam roller child's pose stretch 10 reps Open books with foam roller bilateral 10 reps with diaphragmatic breathing  Education on deep glute massage with foam roller       08/11/2023 Neuroreed- internal pelvic floor assessment with multimodal clues for contraction, relaxation and  lengthening There acts- eval completion, education on fiber, healthy bowel PT recommendations, sitz baths, HEP Hip add with resistance with transverse abdominis breath      08/08/2023  EVAL EVAL Examination completed, findings reviewed, pt educated on POC, HEP, and female pelvic floor anatomy, reasoning with pelvic floor assessment internally with pt consent. Pt motivated to participate in PT and agreeable to attempt recommendations.     PATIENT EDUCATION/ there acts  Education details: Pt was educated on relevant anatomy, exam findings, HEP, expectations of PT   Person educated: Patient Education method: Explanation, Demonstration, Tactile cues, Verbal cues, and Handouts Education comprehension: verbalized understanding, returned demonstration, verbal cues required, tactile cues required, and needs further education  HOME EXERCISE PROGRAM: Access Code: [email or web address] URL: [email or web address] Date: 08/08/2023 Prepared by: Cori Huy Majid  Patient Education - Abdominal Massage for Constipation - High-Fiber Diet to Support Pelvic Health - Bowel Emptying Techniques - Abdominal Massage for Constipation - Urinary Incontinence - Urinary Urge Control Techniques  ASSESSMENT:  CLINICAL  IMPRESSION: Patient was seen today for treatment of SUI.  Patient did well  with exercises and education today. Tx focus- core coordination exercises with breathing. Patient req some VC's  We discussed progress, HEP and recommended consistency. HEP updated. Patient is progressing well towards goals and will benefit from continued PT to address deficits, reduce leaking and improve quality of life.         Last session Patient was seen today for treatment of anal fissure and stress urinary incontinence. Patient with high tone rectally and low tone and weakness vaginally. Reported pain with palpation around 4/10 rectally. Patient did fairly well with initial exercises, for pelvic floor weakness. We  discussed progress, fiber intake and recommended consistency with HEP, some vaginal estrogen and sitz baths. Treatment session focused on finishing eval and internal pelvic floor assessment. Patient had some difficulty with coordination of pelvic floor with breathing. Patient  will benefit from continued PT to address deficits, reduce pain with bowel movements, stress urinary incontinence and improve quality of life.      eval Patient is a 43 y.o. F who was seen today for physical therapy evaluation and treatment for incomplete defecation and mixed urinary incontinence. Patient reported that she has had anal fissure for about a year now, has been taking miralax and was recommended sitz baths which have been difficult to do due to being busy with kids and work. She reported that she leaks with urgency and coughing and sneezing and would like to address that . Exam findings are notable for upper chest breathing strategies, abdominal weakness with diastasis rectus abdominis with distortion,  pelvic floor muscles will be assessed next visit due to lack of time today. Patient demonstrates good trunk flexibility, bilateral hip weakness, decreased strength in core. It is difficult for patient to make it to the bathroom at times  due to strong urinary urgency . Discussed findings with patient, recommended starting to work on healthy bowel recommendations and urge suppression strategies today, patient was educated on squatty potty, fiber, abdominal massage  and HEP was initiated. Patient's quality of life has been affected, patient will benefit from physical therapy to address deficits, reduce leaking and improve bowel emptying.    OBJECTIVE IMPAIRMENTS: decreased knowledge of condition, decreased strength, impaired tone, and pain.   ACTIVITY LIMITATIONS: bending, continence, and toileting  PARTICIPATION LIMITATIONS: community activity  PERSONAL FACTORS: Time since onset of injury/illness/exacerbation are also  affecting patient's functional outcome.   REHAB POTENTIAL: Good  CLINICAL DECISION MAKING: Evolving/moderate complexity  EVALUATION COMPLEXITY: Moderate   GOALS: Goals reviewed with patient? Yes  SHORT TERM GOALS: Target date: 09/05/2023    Patient will be I with abdominal massage Baseline: Goal status: met  2.  Patient will teach back urge suppression strategies Baseline:  Goal status: met  3.   Pt will be I and consistent with her HEP and demonstrate all exercises correctly  Baseline:  Goal status:   4.  Pt will be educated on vaginal moisturizers to improve vaginal tissue health and improve lengthening of the tissue for continence and defecation.  Baseline:  Goal status: INITIAL    LONG TERM GOALS: Target date: 02/07/2024  Patient will report complete rectum emptying 100% of time Baseline:  Goal status: INITIAL  2.  Pt will soak 0 pads/ day in order to run errands and not have to interrupt daily tasks.  Baseline: 1-2 Goal status: INITIAL  3.  Patient will not leak urine with sneezing and laughing Baseline:  Goal status:  INITIAL  4.  Patient will demonstrate good pelvic floor PROM/ AROM and be able to contract/ relax and bulge Baseline:  Goal status: INITIAL  5.  Pt will demonstrate 10 point improvement in PFIQ-7 score in order to show functional improvement in urinary incontinence.   Baseline: 10 Goal status: INITIAL    PLAN:  PT FREQUENCY: 1-2x/week  PT DURATION: 6 months  PLANNED INTERVENTIONS: 97110-Therapeutic exercises, 97530- Therapeutic activity, 97112- Neuromuscular re-education, 97535- Self Care, [id number]- Manual therapy, [phone]- Electrical stimulation (manual), [phone] (1-2 muscles), [phone] (3+ muscles)- Dry Needling, Patient/Family education, Taping, Joint mobilization, Joint manipulation, Spinal manipulation, Spinal mobilization, Scar mobilization, Cryotherapy, Moist heat, and Biofeedback  PLAN FOR NEXT SESSION: internal pelvic floor muscle  assessment,  review urge suppression techniques, bladder diary, introduce TTNS, pelvic floor exercises for pressure management   Leeta Grimme, PT 10/20/2023, 10:59 AM

## 2023-11-25 ENCOUNTER — Ambulatory Visit: Admitting: Physical Therapy

## 2023-12-05 ENCOUNTER — Ambulatory Visit: Attending: Gastroenterology | Admitting: Physical Therapy

## 2023-12-05 ENCOUNTER — Encounter: Payer: Self-pay | Admitting: Physical Therapy

## 2023-12-05 DIAGNOSIS — R293 Abnormal posture: Secondary | ICD-10-CM | POA: Diagnosis present

## 2023-12-05 DIAGNOSIS — M6281 Muscle weakness (generalized): Secondary | ICD-10-CM | POA: Insufficient documentation

## 2023-12-05 NOTE — Therapy (Signed)
 OUTPATIENT PHYSICAL THERAPY FEMALE PELVIC treatment   Patient Name: Jacqueline Skinner MRN: [id number] DOB:05/31/1980, 43 y.o., female Today's Date: 12/05/2023  END OF SESSION:  PT End of Session - 12/05/23 1012     Visit Number 5    Date for Recertification  02/07/23    Authorization Type bcbs [id number]  no auth req    PT Start Time 1013    PT Stop Time 1100    PT Time Calculation (min) 47 min    Activity Tolerance Patient tolerated treatment well    Behavior During Therapy [facility] for tasks assessed/performed           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 (Bovard)   Anal fissure    Angular cheilitis 06/02/2020   Anxiety    Ear fullness, left 03/13/2021   Essential hypertension-postpartum    GERD (gastroesophageal reflux disease)    Hemorrhoid    PROM (premature rupture of membranes) 03/07/2017   SVD (spontaneous vaginal delivery) 03/07/2017   Twitching 03/13/2021   Past Surgical History:  Procedure Laterality Date   LEEP  2017   abnormal pap x2 with HR HPV, coplo LGSIL, rare cells +HGSIL (Dr Rolan)   Patient Active Problem List   Diagnosis Date Noted   Hyperlipidemia 07/18/2023   Rectal fissure 06/20/2022   GAD (generalized anxiety disorder) 06/20/2022   GERD (gastroesophageal reflux disease) 03/13/2021   Frequent headaches 06/02/2020   Preventative health care 06/02/2020   Seasonal allergies 04/27/2019   Essential hypertension-postpartum 04/22/2017   SACROILIITIS 10/13/2007    PCP: Gretta Comer POUR, NP  REFERRING PROVIDER: Shila Gustav GAILS, MD  REFERRING DIAG: M62.89 (ICD-10-CM) - Pelvic floor dysfunction 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-10-CM) - Dyssynergic defecation  THERAPY DIAG:  Muscle weakness (generalized)  Abnormal posture  Rationale for Evaluation and Treatment: Rehabilitation  ONSET DATE: 2024  SUBJECTIVE:                                                                                                                                                                                            SUBJECTIVE STATEMENT: Patient returning to PT after 6 weeks Report that she has not done her exercises as much as she could Has had 1 accident Constipation has been OK- goes every morning, stool softener helps Anal fissure is still there, does not know if cream is helping too.  Patient reports that she drinks a lot of water. Feels like SUI is getting better overall.  Patient reports that she has not worked out in a while.  Last session Patient reports that her poop does not want to come all the way out at times because it hurts. Has not been taking anything over the weekend, working on more fiber. Ate some chickpeas.  Patient reports that she checked and where she had episiotomy that is where the fissure is.    PAIN:  Are you having pain? No   PRECAUTIONS: None  RED FLAGS: None   WEIGHT BEARING RESTRICTIONS: No  FALLS:  Has patient fallen in last 6 months? No  OCCUPATION: real estate agent- desk job  ACTIVITY LEVEL : not real active, chores and dishes, walks the dog down the road, has an autistic son  PLOF: Independent  PATIENT GOALS: reduced urine leaking  PERTINENT HISTORY:  Anal fissure Sexual abuse: No  BOWEL MOVEMENT: usually poops after drinking coffee every morning  Pain with bowel movement: Yes it depends medium Type of bowel movement:Type (Bristol Stool Scale) 1 or 4, Frequency 2/ day, Strain yes sometimes, and Splinting no Fully empty rectum: No Leakage: No Pads: No Fiber supplement/laxative Yes has been doing Miralax because she has had it for her son  URINATION: Pain with urination: No Fully empty bladder: Nopee runs out when she check her fissure sometimes Stream: Strong Urgency: Yes  Frequency: no Leakage: Urge to void, Walking to the bathroom, Coughing, Sneezing, Laughing, Exercise, and Bending forward Pads:  Yes: 1  INTERCOURSE:  Ability to have vaginal penetration Yes  Pain with intercourse: not really, sometimes will bother the fissure DrynessYes maybe  a little [place]ax: still does some Marinoff Scale: 0/3 Laxative:yes  PREGNANCY: Vaginal deliveries 2 - 16 and 6  Tearing No Episiotomy Yes  C-section deliveries 0 Currently pregnant No  PROLAPSE: None   OBJECTIVE:  Note: Objective measures were completed at Evaluation unless otherwise noted.    PATIENT SURVEYS:    PFIQ-7: 10  COGNITION: Overall cognitive status: Within functional limits for tasks assessed     SENSATION: Light touch: Appears intact  LUMBAR SPECIAL TESTS:  Straight leg raise test: Positive for twisting in supine for compensation Single leg squat- dips hips and bilateral knee valgus    POSTURE: No Significant postural limitations, rounded shoulders, and forward head   LUMBARAROM/PROM: full  LOWER EXTREMITY ROM: full   LOWER EXTREMITY MMT: bilateral hips 4-/5  PALPATION:   General: 2 finger abdominal DRA   Pelvic Alignment: even                 External Perineal Exam: signs of decreased estrogen- labial phimosis present                              Internal Pelvic Floor: weakness vaginally and tightness and high tone rectally, patient tends to bulge when asked to contract  Patient confirms identification and approves PT to assess internal pelvic floor and treatment Yes Patient confirms identification and approves PT to assess internal pelvic floor and treatment Yes No emotional/communication barriers or cognitive limitation. Patient is motivated to learn. Patient understands and agrees with treatment goals and plan. PT explains patient will be examined in standing, sitting, and lying down to see how their muscles and joints work. When they are ready, they will be asked to remove their underwear so PT can examine their perineum. The patient is also given the option of providing their own  chaperone as one is not provided in our facility. The patient also has the right and is explained the  right to defer or refuse any part of the evaluation or treatment including the internal exam. With the patient's consent, PT will use one gloved finger to gently assess the muscles of the pelvic floor, seeing how well it contracts and relaxes and if there is muscle symmetry. After, the patient will get dressed and PT and patient will discuss exam findings and plan of care. PT and patient discuss plan of care, schedule, attendance policy and HEP activities.     PELVIC MMT:   MMT eval  Vaginal 0/5  Internal Anal Sphincter 4/5  External Anal Sphincter 4/5  Puborectalis   Diastasis Recti  2 fingers, at umbilicus and above umbilicus  (Blank rows = not tested)        TONE: High rectally, low vaginally  PROLAPSE: Mild anterior and posterior vaginal wall laxity, mild bladder prolapse with cough in hooklying   TODAY'S TREATMENT:                                                                                                                              DATE:  12/05/2023 Quadruped with ball resistance with transverse abdominis breath 20 reps bilateral  VC's for abdominal contraction and pelvic floor lift throughout session   Urge suppression techniques review  Bird dog  with exhale 2x 10 reps  Child's pose with diaphragmatic breathing 2 mins for pelvic floor relaxation  Bridging with #5 KB with exhale 15 reps   10/20/2023 Supine ball press with transverse abdominis breath 15 breaths  Supine horizontal abduction with thera band with transverse abdominis breath with hip adduction with ball 15 reps Quadruped with ball resistance with transverse abdominis breath 15 reps #10 kb pick up off floor transverse abdominis breath 15 reps Chin tuck      10/13/2023 Pectoral Corner stretch 30 secs Foam roller routine with VC's to breathe deep 12 reps each Foam roller child's pose stretch 10  reps Open books with foam roller bilateral 10 reps with diaphragmatic breathing  Education on deep glute massage with foam roller       08/11/2023 Neuroreed- internal pelvic floor assessment with multimodal clues for contraction, relaxation and lengthening There acts- eval completion, education on fiber, healthy bowel PT recommendations, sitz baths, HEP Hip add with resistance with transverse abdominis breath      08/08/2023  EVAL EVAL Examination completed, findings reviewed, pt educated on POC, HEP, and female pelvic floor anatomy, reasoning with pelvic floor assessment internally with pt consent. Pt motivated to participate in PT and agreeable to attempt recommendations.     PATIENT EDUCATION/ there acts  Education details: Pt was educated on relevant anatomy, exam findings, HEP, expectations of PT   Person educated: Patient Education method: Explanation, Demonstration, Tactile cues, Verbal cues, and Handouts Education comprehension: verbalized understanding, returned demonstration, verbal cues required, tactile cues required, and needs further education  HOME EXERCISE PROGRAM: Access Code: [email or web address] URL: [email or web address] Date: 08/08/2023 Prepared by: Cori Messiyah Waterson  Patient Education -  Abdominal Massage for Constipation - High-Fiber Diet to Support Pelvic Health - Bowel Emptying Techniques - Abdominal Massage for Constipation - Urinary Incontinence - Urinary Urge Control Techniques  ASSESSMENT:  CLINICAL IMPRESSION: Patient was seen today for treatment of SUI.  Patient did well  with exercises and education today. Tx focus- core coordination exercises with breathing. Patient req some VC's  We discussed progress, HEP and recommended consistency. HEP updated. Added urge suppression technique training and recommended to avoid just in case peeing.  Patient reported that she has not been constipated, but has anal fissure and needs to be more consistent with  her cream. Patient is progressing well towards goals and will benefit from continued PT to address deficits, reduce leaking and improve quality of life.         Last session Patient was seen today for treatment of anal fissure and stress urinary incontinence. Patient with high tone rectally and low tone and weakness vaginally. Reported pain with palpation around 4/10 rectally. Patient did fairly well with initial exercises, for pelvic floor weakness. We discussed progress, fiber intake and recommended consistency with HEP, some vaginal estrogen and sitz baths. Treatment session focused on finishing eval and internal pelvic floor assessment. Patient had some difficulty with coordination of pelvic floor with breathing. Patient  will benefit from continued PT to address deficits, reduce pain with bowel movements, stress urinary incontinence and improve quality of life.      eval Patient is a 43 y.o. F who was seen today for physical therapy evaluation and treatment for incomplete defecation and mixed urinary incontinence. Patient reported that she has had anal fissure for about a year now, has been taking miralax and was recommended sitz baths which have been difficult to do due to being busy with kids and work. She reported that she leaks with urgency and coughing and sneezing and would like to address that . Exam findings are notable for upper chest breathing strategies, abdominal weakness with diastasis rectus abdominis with distortion,  pelvic floor muscles will be assessed next visit due to lack of time today. Patient demonstrates good trunk flexibility, bilateral hip weakness, decreased strength in core. It is difficult for patient to make it to the bathroom at times  due to strong urinary urgency . Discussed findings with patient, recommended starting to work on healthy bowel recommendations and urge suppression strategies today, patient was educated on squatty potty, fiber, abdominal massage  and HEP  was initiated. Patient's quality of life has been affected, patient will benefit from physical therapy to address deficits, reduce leaking and improve bowel emptying.    OBJECTIVE IMPAIRMENTS: decreased knowledge of condition, decreased strength, impaired tone, and pain.   ACTIVITY LIMITATIONS: bending, continence, and toileting  PARTICIPATION LIMITATIONS: community activity  PERSONAL FACTORS: Time since onset of injury/illness/exacerbation are also affecting patient's functional outcome.   REHAB POTENTIAL: Good  CLINICAL DECISION MAKING: Evolving/moderate complexity  EVALUATION COMPLEXITY: Moderate   GOALS: Goals reviewed with patient? Yes  SHORT TERM GOALS: Target date: 09/05/2023    Patient will be I with abdominal massage Baseline: Goal status: met  2.  Patient will teach back urge suppression strategies Baseline:  Goal status: met  3.   Pt will be I and consistent with her HEP and demonstrate all exercises correctly  Baseline:  Goal status:   4.  Pt will be educated on vaginal moisturizers to improve vaginal tissue health and improve lengthening of the tissue for continence and defecation.  Baseline:  Goal status: met  12/05/2023    LONG TERM GOALS: Target date: 02/07/2024  Patient will report complete rectum emptying 100% of time Baseline:  Goal status: INITIAL  2.  Pt will soak 0 pads/ day in order to run errands and not have to interrupt daily tasks.  Baseline: 1-2 Goal status: progressing (0 pads) 12/05/2023  3.  Patient will not leak urine with sneezing and laughing Baseline:  Goal status: ongoing 12/05/2023  4.  Patient will demonstrate good pelvic floor PROM/ AROM and be able to contract/ relax and bulge Baseline:  Goal status: INITIAL  5.  Pt will demonstrate 10 point improvement in PFIQ-7 score in order to show functional improvement in urinary incontinence.   Baseline: 10 Goal status: ongoing 12/05/2023    PLAN:  PT FREQUENCY:  1-2x/week  PT DURATION: 6 months  PLANNED INTERVENTIONS: 97110-Therapeutic exercises, 97530- Therapeutic activity, 97112- Neuromuscular re-education, 97535- Self Care, [id number]- Manual therapy, [phone]- Electrical stimulation (manual), [phone] (1-2 muscles), [phone] (3+ muscles)- Dry Needling, Patient/Family education, Taping, Joint mobilization, Joint manipulation, Spinal manipulation, Spinal mobilization, Scar mobilization, Cryotherapy, Moist heat, and Biofeedback  PLAN FOR NEXT SESSION: review urge suppression techniques, bladder diary, introduce TTNS, pelvic floor exercises for pressure management   Maxima Skelton, PT 12/05/2023, 10:15 AM

## 2023-12-12 ENCOUNTER — Encounter: Admitting: Physical Therapy

## 2024-02-01 ENCOUNTER — Other Ambulatory Visit: Payer: Self-pay | Admitting: Gastroenterology
